# Patient Record
Sex: Female | Born: 1965 | Race: Black or African American | Hispanic: No | Marital: Married | Smoking: Never smoker
Health system: Southern US, Community
[De-identification: ages and names within clinical notes are randomized; demographics above are authoritative.]

## PROBLEM LIST (undated history)

## (undated) DIAGNOSIS — D649 Anemia, unspecified: Secondary | ICD-10-CM

## (undated) DIAGNOSIS — O169 Unspecified maternal hypertension, unspecified trimester: Secondary | ICD-10-CM

## (undated) DIAGNOSIS — Z9289 Personal history of other medical treatment: Secondary | ICD-10-CM

## (undated) DIAGNOSIS — I10 Essential (primary) hypertension: Secondary | ICD-10-CM

## (undated) DIAGNOSIS — D259 Leiomyoma of uterus, unspecified: Secondary | ICD-10-CM

## (undated) DIAGNOSIS — H5210 Myopia, unspecified eye: Secondary | ICD-10-CM

## (undated) HISTORY — DX: Personal history of other medical treatment: Z92.89

## (undated) HISTORY — DX: Unspecified maternal hypertension, unspecified trimester: O16.9

## (undated) HISTORY — PX: MYOMECTOMY: SHX85

## (undated) HISTORY — DX: Myopia, unspecified eye: H52.10

## (undated) HISTORY — PX: TONSILLECTOMY: SUR1361

## (undated) HISTORY — DX: Anemia, unspecified: D64.9

## (undated) HISTORY — DX: Essential (primary) hypertension: I10

## (undated) HISTORY — DX: Leiomyoma of uterus, unspecified: D25.9

---

## 2011-04-01 ENCOUNTER — Encounter: Payer: Self-pay | Admitting: Family Medicine

## 2011-05-10 ENCOUNTER — Encounter: Payer: Self-pay | Admitting: Family Medicine

## 2011-06-21 ENCOUNTER — Encounter: Payer: Self-pay | Admitting: Medical

## 2011-06-21 ENCOUNTER — Ambulatory Visit (INDEPENDENT_AMBULATORY_CARE_PROVIDER_SITE_OTHER): Payer: Managed Care, Other (non HMO) | Admitting: Medical

## 2011-06-21 VITALS — BP 142/80 | HR 92 | Temp 98.5°F | Resp 16 | Ht 67.0 in | Wt 199.0 lb

## 2011-06-21 DIAGNOSIS — IMO0001 Reserved for inherently not codable concepts without codable children: Secondary | ICD-10-CM

## 2011-06-21 DIAGNOSIS — Z862 Personal history of diseases of the blood and blood-forming organs and certain disorders involving the immune mechanism: Secondary | ICD-10-CM

## 2011-06-21 DIAGNOSIS — R030 Elevated blood-pressure reading, without diagnosis of hypertension: Secondary | ICD-10-CM

## 2011-06-21 DIAGNOSIS — M79609 Pain in unspecified limb: Secondary | ICD-10-CM

## 2011-06-21 DIAGNOSIS — M25569 Pain in unspecified knee: Secondary | ICD-10-CM

## 2011-06-21 LAB — CBC WITH DIFFERENTIAL/PLATELET
Basophils Absolute: 0 10*3/uL (ref 0.0–0.1)
Basophils Relative: 0 % (ref 0–1)
Eosinophils Absolute: 0.1 10*3/uL (ref 0.0–0.7)
MCH: 21.2 pg — ABNORMAL LOW (ref 26.0–34.0)
MCHC: 30.9 g/dL (ref 30.0–36.0)
Monocytes Relative: 6 % (ref 3–12)
Neutrophils Relative %: 69 % (ref 43–77)
Platelets: 298 10*3/uL (ref 150–400)
RDW: 17.3 % — ABNORMAL HIGH (ref 11.5–15.5)

## 2011-06-21 LAB — BASIC METABOLIC PANEL
Calcium: 9.2 mg/dL (ref 8.4–10.5)
Glucose, Bld: 95 mg/dL (ref 70–99)
Sodium: 138 mEq/L (ref 135–145)

## 2011-06-21 NOTE — Progress Notes (Signed)
Subjective:   HPI  Taylor Robbins is a 46 y.o. female who presents as a new patient today.   She notes 2-3 months ago was in [employer] and all the sudden had a charlie horse feeling in left leg.  The pain went away at least 30 minutes later.  Few days later felt this again, and felt a lump in her leg.  The lump comes and goes.  She notes that the pain has been slowly gotten worse and aggravating.  Feels the pain behind knee and along both sides of posterior knee.  Last week was on vacation and did a lot of walking, not sure if this aggravated things.   Denies leg swelling, no bruising, but husband thought the left leg behind the knee was more swollen compared to right leg.  The symptoms are intermittent.  No other aggravating or relieving factors.  Denies personal hx/o high cholesterol, DVT, or PE, denies injury or trauma to the leg.  No recent trauma, surgery, injury, or long travel.  No other c/o.  The following portions of the patient's history were reviewed and updated as appropriate: allergies, current medications, past family history, past medical history, past social history, past surgical history and problem list.  Past Medical History  Diagnosis Date  . Hypertension in pregnancy   . Anemia   . History of blood transfusion     remote hx/o  . Uterine fibroid   . Nearsightedness     wears glasses  . History of tonsillectomy     No Known Allergies   Review of Systems Gen: no fever, chills, weight changes Skin: no changes Lung: no sob, wheezing Heart: no CP, palpations, edema GI: negative GU: negative Neuro: +some tingling in left foot, but no numbness or weakness ROS reviewed and was negative other than noted in HPI or above.    Objective:   Physical Exam  General appearance: alert, no distress, WD/WN, pleasant black female Skin: no leg ecchymosis, erythema Neck: supple, no lymphadenopathy, no thyromegaly, no masses Heart: RRR, normal S1, S2, no murmurs Lungs: CTA  bilaterally, no wheezes, rhonchi, or rales MSK: mild tenderness of left medial lower thigh, mild tenderness popliteal area, but no obvious mass or palpable cord, no swelling, no asymmetry, otherwise LE nontender and normal ROM Ext: no edema Pulses:  Pedal pulses 1+ but symmetric  Assessment and Plan :     Encounter Diagnoses  Name Primary?  . Pain in joint, lower leg Yes  . Pain in limb   . Elevated blood pressure   . History of anemia    We will get baseline labs and set her up for venous duplex ultrasound of left leg and ABIs.  For now can use warm compresses, baby aspirin daily.

## 2011-06-22 ENCOUNTER — Other Ambulatory Visit: Payer: Self-pay | Admitting: Medical

## 2011-06-22 MED ORDER — FERROUS GLUCONATE 325 MG PO TABS: ORAL_TABLET | ORAL | Status: DC

## 2011-06-22 NOTE — Progress Notes (Signed)
Addended by: Leretha Dykes L on: 06/22/2011 02:50 PM   Modules accepted: Orders

## 2011-06-25 NOTE — Progress Notes (Signed)
Addended by: Leretha Dykes L on: 06/25/2011 11:17 AM   Modules accepted: Orders

## 2011-06-28 ENCOUNTER — Other Ambulatory Visit: Payer: Self-pay | Admitting: Medical

## 2011-06-28 ENCOUNTER — Ambulatory Visit
Admission: RE | Admit: 2011-06-28 | Discharge: 2011-06-28 | Disposition: A | Discharge status: Home / Self Care | Payer: Managed Care, Other (non HMO) | Source: Ambulatory Visit | Attending: Medical | Admitting: Medical

## 2011-06-28 DIAGNOSIS — M25569 Pain in unspecified knee: Secondary | ICD-10-CM

## 2011-06-28 DIAGNOSIS — M79609 Pain in unspecified limb: Secondary | ICD-10-CM

## 2011-07-05 ENCOUNTER — Inpatient Hospital Stay
Admission: RE | Admit: 2011-07-05 | Discharge: 2011-07-05 | Payer: Managed Care, Other (non HMO) | Source: Ambulatory Visit | Attending: Medical | Admitting: Medical

## 2011-07-05 ENCOUNTER — Other Ambulatory Visit: Payer: Managed Care, Other (non HMO)

## 2011-07-19 ENCOUNTER — Other Ambulatory Visit: Payer: Managed Care, Other (non HMO)

## 2011-07-22 ENCOUNTER — Encounter: Payer: Self-pay | Admitting: Family Medicine

## 2011-07-22 ENCOUNTER — Ambulatory Visit (INDEPENDENT_AMBULATORY_CARE_PROVIDER_SITE_OTHER): Payer: Managed Care, Other (non HMO) | Admitting: Family Medicine

## 2011-07-22 VITALS — BP 144/100 | HR 75 | Wt 197.0 lb

## 2011-07-22 DIAGNOSIS — IMO0001 Reserved for inherently not codable concepts without codable children: Secondary | ICD-10-CM

## 2011-07-22 DIAGNOSIS — R030 Elevated blood-pressure reading, without diagnosis of hypertension: Secondary | ICD-10-CM

## 2011-07-22 NOTE — Progress Notes (Signed)
  Subjective:    Patient ID: Taylor Robbins, female    DOB: [birth year], 46 y.o.   MRN: [id number]  HPI She is here for a blood pressure check. Apparently recently at her dentist office she did have an elevated pressure. Review of record indicates she also had slightly elevated pressure with her last visit. She has a previous history of pregnancy related hypertension.   Review of Systems     Objective:   Physical Exam Alert and in no distress. Blood pressure is recorded.       Assessment & Plan:   1. Elevated blood pressure    approximately 15 minutes spent discussing elevated blood pressure in regard to diet, exercise, footcare, medications, caffeine and alcohol. She will start a diet and exercise program and return here in one month for a recheck.

## 2011-07-22 NOTE — Patient Instructions (Signed)
Work on diet, exercise, weight reduction, cutting back on salt. Cut back on "white food"

## 2011-08-23 ENCOUNTER — Ambulatory Visit (INDEPENDENT_AMBULATORY_CARE_PROVIDER_SITE_OTHER): Payer: Managed Care, Other (non HMO) | Admitting: Family Medicine

## 2011-08-23 ENCOUNTER — Encounter: Payer: Self-pay | Admitting: Family Medicine

## 2011-08-23 VITALS — BP 144/90 | HR 78 | Wt 198.0 lb

## 2011-08-23 DIAGNOSIS — I10 Essential (primary) hypertension: Secondary | ICD-10-CM

## 2011-08-23 MED ORDER — HYDROCHLOROTHIAZIDE 12.5 MG PO CAPS: 12.5000 mg | ORAL_CAPSULE | Freq: Every day | ORAL | Status: DC

## 2011-08-23 NOTE — Progress Notes (Signed)
  Subjective:    Patient ID: Taylor Robbins, female    DOB: 11-Nov-1965, 46 y.o.   MRN: [id number]  HPI Is here for recheck. She states that she has made diet and exercise changes.   Review of Systems     Objective:   Physical Exam Hurt and in no distress. Blood pressure and weight were reviewed.       Assessment & Plan:   1. Hypertension  hydrochlorothiazide (MICROZIDE) 12.5 MG capsule   since her blood pressures not really changed, will place her on HCTZ. I discussed the fact she is to continue diet and exercise. Recheck here in one month

## 2011-09-21 ENCOUNTER — Encounter: Payer: Self-pay | Admitting: Family Medicine

## 2011-09-21 ENCOUNTER — Ambulatory Visit (INDEPENDENT_AMBULATORY_CARE_PROVIDER_SITE_OTHER): Payer: Managed Care, Other (non HMO) | Admitting: Family Medicine

## 2011-09-21 VITALS — BP 132/88 | HR 86 | Wt 199.0 lb

## 2011-09-21 DIAGNOSIS — I10 Essential (primary) hypertension: Secondary | ICD-10-CM

## 2011-09-21 DIAGNOSIS — E669 Obesity, unspecified: Secondary | ICD-10-CM

## 2011-09-21 NOTE — Progress Notes (Signed)
  Subjective:    Patient ID: Taylor Robbins, female    DOB: 1966-02-13, 46 y.o.   MRN: [id number]  HPI She is here for recheck. She is taking her fluid pill without difficulty. She has not been able to exercise like she likes.   Review of Systems     Objective:   Physical Exam  Alert and in no distress otherwise not examined blood pressure is recorded      Assessment & Plan:  Hypertension now under good control. Continue present medication regimen. I encouraged her to become more physically active.

## 2011-11-04 ENCOUNTER — Ambulatory Visit (INDEPENDENT_AMBULATORY_CARE_PROVIDER_SITE_OTHER): Payer: Managed Care, Other (non HMO) | Admitting: Family Medicine

## 2011-11-04 ENCOUNTER — Encounter: Payer: Self-pay | Admitting: Family Medicine

## 2011-11-04 VITALS — BP 126/70 | HR 80 | Wt 196.0 lb

## 2011-11-04 DIAGNOSIS — I10 Essential (primary) hypertension: Secondary | ICD-10-CM

## 2011-11-04 NOTE — Progress Notes (Signed)
  Subjective:    Patient ID: Taylor Robbins, female    DOB: 08-01-1965, 46 y.o.   MRN: [id number]  HPI She has a three-week history of a popping sensation in her ears and slight dizziness . She is concerned that this could be related to her blood pressure medication. She has no underlying history of allergies and has had no difficulty with sneezing, itchy watery eyes, rhinorrhea.   Review of Systems     Objective:   Physical Exam Alert and in no distress. TMs clear. Throat is clear. Neck is supple without adenopathy. Blood pressure is recorded      Assessment & Plan:  Hypertension. Continue on present medication. If she has further difficulty with popping in the dizziness, suggested judicious use of a decongestant for short period of time.

## 2011-11-10 ENCOUNTER — Telehealth: Payer: Self-pay | Admitting: Family Medicine

## 2011-11-10 NOTE — Telephone Encounter (Signed)
LM

## 2011-11-12 ENCOUNTER — Encounter: Payer: Self-pay | Admitting: Medical

## 2011-11-12 ENCOUNTER — Ambulatory Visit (INDEPENDENT_AMBULATORY_CARE_PROVIDER_SITE_OTHER): Payer: Managed Care, Other (non HMO) | Admitting: Medical

## 2011-11-12 VITALS — BP 128/90 | HR 80 | Temp 98.3°F | Resp 16 | Wt 201.0 lb

## 2011-11-12 DIAGNOSIS — I10 Essential (primary) hypertension: Secondary | ICD-10-CM

## 2011-11-12 DIAGNOSIS — D649 Anemia, unspecified: Secondary | ICD-10-CM

## 2011-11-12 DIAGNOSIS — R42 Dizziness and giddiness: Secondary | ICD-10-CM

## 2011-11-12 LAB — T4, FREE: Free T4: 1.25 ng/dL (ref 0.80–1.80)

## 2011-11-12 LAB — RETICULOCYTES
ABS Retic: 80.3 10*3/uL (ref 19.0–186.0)
RBC.: 4.46 MIL/uL (ref 3.87–5.11)
Retic Ct Pct: 1.8 % (ref 0.4–2.3)

## 2011-11-12 LAB — IRON AND TIBC
%SAT: 15 % — ABNORMAL LOW (ref 20–55)
Iron: 61 ug/dL (ref 42–145)
TIBC: 399 ug/dL (ref 250–470)
UIBC: 338 ug/dL (ref 125–400)

## 2011-11-12 LAB — CBC WITH DIFFERENTIAL/PLATELET
HCT: 33.2 % — ABNORMAL LOW (ref 36.0–46.0)
Hemoglobin: 11.4 g/dL — ABNORMAL LOW (ref 12.0–15.0)
Lymphocytes Relative: 22 % (ref 12–46)
Lymphs Abs: 1.2 10*3/uL (ref 0.7–4.0)
MCHC: 34.3 g/dL (ref 30.0–36.0)
Monocytes Absolute: 0.4 10*3/uL (ref 0.1–1.0)
Monocytes Relative: 7 % (ref 3–12)
Neutro Abs: 3.7 10*3/uL (ref 1.7–7.7)
Neutrophils Relative %: 68 % (ref 43–77)
RBC: 4.46 MIL/uL (ref 3.87–5.11)

## 2011-11-12 LAB — FERRITIN: Ferritin: 12 ng/mL (ref 10–291)

## 2011-11-12 LAB — HIGH SENSITIVITY CRP: CRP, High Sensitivity: 2.3 mg/L

## 2011-11-12 LAB — TSH: TSH: 2.424 u[IU]/mL (ref 0.350–4.500)

## 2011-11-12 MED ORDER — MECLIZINE HCL 25 MG PO TABS: 25.0000 mg | ORAL_TABLET | Freq: Three times a day (TID) | ORAL | Status: AC | PRN

## 2011-11-12 NOTE — Progress Notes (Addendum)
Subjective: Here for concerns about EKG.  Last week had a physical for life insurance application.  They ended up calling her back and telling her she had EKG that was abnormal and to f/u with primary care.  In general other than some disequilibrium and dizziness, she denies other health concerns.  No chest pain, SOB, DOE, palpitations, syncope, no nausea, sweats, numbness.  She typically exercises with spin class or walking a few days per week.  She is a nonsmoker.    Of note, I last saw her back in April for anemia.   She was started on iron.  She denies any bleeding.  She does have hx/o fibroids but they were surgically removed. No prior colonoscopy.  She reports intermittent dizziness that last for seconds, like room spinning.   Saw Dr. Susann Givens for this recently, but the symptoms persist.  Denies cold symptoms fever, syncope or vision changes.  No weakness, numbness, tingling.   Past Medical History  Diagnosis Date  . Hypertension in pregnancy   . Anemia   . History of blood transfusion     remote hx/o  . Uterine fibroid   . Nearsightedness     wears glasses  . History of tonsillectomy    No Known Allergies  Current Outpatient Prescriptions on File Prior to Visit  Medication Sig Dispense Refill  . ferrous gluconate (FERGON) 325 MG tablet 1 tablet po BID with orange juice or meals  60 tablet  3  . hydrochlorothiazide (MICROZIDE) 12.5 MG capsule Take 1 capsule (12.5 mg total) by mouth daily.  30 capsule  11    Past Medical History  Diagnosis Date  . Hypertension in pregnancy   . Anemia   . History of blood transfusion     remote hx/o  . Uterine fibroid   . Nearsightedness     wears glasses  . History of tonsillectomy     Past Surgical History  Procedure Date  . Myomectomy   . Cesarean section     Family History  Problem Relation Age of Onset  . Hypertension Father   . Stroke Father     died of CVA  . Heart disease Father     CHF  . Hypertension Mother   . Heart  disease Sister 30    valvuloplasty    History   Social History  . Marital Status: Married    Spouse Name: N/A    Number of Children: N/A  . Years of Education: N/A   Occupational History  . Not on file.   Social History Main Topics  . Smoking status: Never Smoker   . Smokeless tobacco: Not on file  . Alcohol Use: No  . Drug Use: No  . Sexually Active: Not on file   Other Topics Concern  . Not on file   Social History Narrative  . No narrative on file    Reviewed their medical, surgical, family, social, medication, and allergy history and updated chart as appropriate.  ROS as noted in HPI   Objective:   Physical Exam  Filed Vitals:   11/12/11 1006  BP: 128/90  Pulse: 80  Temp: 98.3 F (36.8 C)  Resp: 16    General appearance: alert, no distress, WD/WN Eyes with mild exophthalmos Neck: supple, no lymphadenopathy, no thyromegaly, no masses, no bruits Heart: RRR, normal S1, S2, no murmurs Lungs: CTA bilaterally, no wheezes, rhonchi, or rales Pulses: 2+ symmetric, upper and lower extremities, normal cap refill   Adult ECG Report  Indication: report of insurance physical abnormal EKG, HTN  Rate: 77bpm  Rhythm: normal sinus rhythm  QRS Axis: 18 degrees  PR Interval:  QRS Duration: 98ms  QTc:  Conduction Disturbances: none  Other Abnormalities: nonspecific T wave flattening V3-6,   Patient's cardiac risk factors are: family history of premature cardiovascular disease and hypertension.  EKG comparison: insurance physical EKG 11/08/11   Narrative Interpretation: no acute change or change from 11/08/11, nonspecific T wave abnormality in III and V3-6, no acute changes    Assessment and Plan :    Encounter Diagnoses  Name Primary?  . Essential hypertension, benign Yes  . Anemia   . Dizziness     HTN - c/t same medication, HCTZ 12.5mg  daily.   Reviewed EKG today.  No major worrisome findings.   We discussed her risk factors for heart disease  (premature family hx/o, HTN).  Advised she increase exercise, eat healthy low fat diet.  I reviewed her recent labs including cholesterol.  We discussed possible statin, but will try 66mo of lifestyle interventions first.  Additional labs today for cardiac eval (CRP, homocysteine, Vit D).  Discussed additional cardiac eval such as stress test if she desires, but no urgent referral needed for evaluation.  Anemia - additional labs today.   May need GI referral.  Dizziness - script trial for meclizine for vertigo.

## 2011-11-13 LAB — HOMOCYSTEINE: Homocysteine: 13.1 umol/L (ref 4.0–15.4)

## 2011-11-16 ENCOUNTER — Other Ambulatory Visit: Payer: Self-pay | Admitting: Medical

## 2011-11-16 MED ORDER — FERROUS GLUCONATE 324 (38 FE) MG PO TABS: 324.0000 mg | ORAL_TABLET | Freq: Two times a day (BID) | ORAL | Status: DC

## 2011-11-17 ENCOUNTER — Encounter: Payer: Self-pay | Admitting: Medical

## 2011-11-17 ENCOUNTER — Telehealth: Payer: Self-pay | Admitting: Family Medicine

## 2011-11-17 NOTE — Telephone Encounter (Signed)
Patient is aware of her appointment to see the cardiologists on 12/08/11 @ [facility]. CLS

## 2011-11-17 NOTE — Telephone Encounter (Signed)
Patient states that she went by her pharmacy and her RX for Vit. D and it was not there so can you send the RX in to the pharmacy for her to pick up. Thanks  CLS

## 2011-11-17 NOTE — Telephone Encounter (Signed)
Patient is aware of her appointment with cardiologist. [facility] 12/08/11 @ 1030 am With Dr. Shirlee Latch [phone]  They are on EPIC

## 2011-11-17 NOTE — Telephone Encounter (Signed)
Message copied by Janeice Robinson on Wed Nov 17, 2011  3:28 PM ------      Message from: Jac Canavan      Created: Tue Nov 16, 2011  8:16 AM       See other msg.   After reviewing her EKG with Dr. Susann Givens, there are some subtle changes in 2 leads which may or may not represent ischemia which means a change in blood flow to parts of the heart.  Thus, lets go ahead with cardiology referral for evaluation just to make sure nothing is at risk of blockage.

## 2011-11-18 ENCOUNTER — Encounter: Payer: Self-pay | Admitting: Family Medicine

## 2011-11-18 ENCOUNTER — Telehealth: Payer: Self-pay | Admitting: Family Medicine

## 2011-11-18 ENCOUNTER — Telehealth: Payer: Self-pay | Admitting: Medical

## 2011-11-18 MED ORDER — ERGOCALCIFEROL 1.25 MG (50000 UT) PO CAPS: 50000.0000 [IU] | ORAL_CAPSULE | ORAL | Status: DC

## 2011-11-18 NOTE — Telephone Encounter (Signed)
Let her know that a multivitamin with B12 is certainly appropriate. If she wants to discuss possible injections have her make an appointment so we can discuss this in more detail

## 2011-11-18 NOTE — Telephone Encounter (Signed)
Message copied by Janeice Robinson on Thu Nov 18, 2011  8:41 AM ------      Message from: Aleen Campi, DAVID S      Created: Wed Nov 17, 2011  6:01 PM       pls send letter to insurance as requested.  The copy is in nurse station printer

## 2011-11-18 NOTE — Telephone Encounter (Signed)
I fax over the letter to Antonietta Jewel @ [phone] per the patients request. CLS

## 2011-11-18 NOTE — Telephone Encounter (Signed)
I sent the RX for the Vitamin D to her pharmacy and I lmom letting her know it was sent to her pharmacy. CLS

## 2011-11-18 NOTE — Telephone Encounter (Signed)
Pt aware and verbalized understanding.  

## 2011-11-18 NOTE — Telephone Encounter (Signed)
Pt called and stated that she is interested in taking vit b 12. She would like to know if that is ok. Please call pt.

## 2011-12-06 ENCOUNTER — Other Ambulatory Visit (INDEPENDENT_AMBULATORY_CARE_PROVIDER_SITE_OTHER): Payer: Managed Care, Other (non HMO)

## 2011-12-06 DIAGNOSIS — Z139 Encounter for screening, unspecified: Secondary | ICD-10-CM

## 2011-12-06 LAB — HEMOCCULT GUIAC POC 1CARD (OFFICE): Card #3 Fecal Occult Blood, POC: POSITIVE

## 2011-12-07 ENCOUNTER — Encounter: Payer: Self-pay | Admitting: Internal Medicine

## 2011-12-08 ENCOUNTER — Encounter: Payer: Self-pay | Admitting: Cardiology

## 2011-12-08 ENCOUNTER — Ambulatory Visit (INDEPENDENT_AMBULATORY_CARE_PROVIDER_SITE_OTHER): Payer: Managed Care, Other (non HMO) | Admitting: Cardiology

## 2011-12-08 VITALS — BP 121/100 | HR 82 | Resp 18 | Ht 66.0 in | Wt 200.4 lb

## 2011-12-08 DIAGNOSIS — I10 Essential (primary) hypertension: Secondary | ICD-10-CM

## 2011-12-08 DIAGNOSIS — R9431 Abnormal electrocardiogram [ECG] [EKG]: Secondary | ICD-10-CM

## 2011-12-08 NOTE — Progress Notes (Signed)
Patient ID: Taylor Robbins, female   DOB: [birth year], 46 y.o.   MRN: [id number] PCP: Dr. Susann Givens  46 yo with history of HTN presents for evaluation of abnormal ECG.  Patient did a life insurance physical and was told that her ECG was abnormal and she needed further evaluation.  Her PCP referred her to this office.  She was recently diagnosed with HTN (8/13) and started on HCTZ.  BP is high today but she did not take her HCTZ.  She does well in general.  She has intermittent positional vertigo.  She exercises regularly, including a spin class 2-3 x /wk.  No exertional dyspnea, chest pain, syncope, or lightheadedness.  Her ECG showed NSR with nonspecific anterior and lateral T wave flattening.   Labs (8/13): TSH normal  PMH: 1. BPPV 2. HTN 3. Anemia  SH: Nonsmoker.  Married, works at [employer].    FH: Father with CHF and CVA at 65.  Sister with heart valve replacement in her 7s.  HTN.  ROS: All systems reviewed and negative except as per HPI.   Current Outpatient Prescriptions  Medication Sig Dispense Refill  . aspirin 81 MG tablet Take 81 mg by mouth daily.      . ergocalciferol (VITAMIN D2) 50000 UNITS capsule Take 1 capsule (50,000 Units total) by mouth once a week.  4 capsule  1  . ferrous gluconate (FERGON) 324 MG tablet Take 1 tablet (324 mg total) by mouth 2 (two) times daily.  60 tablet  2  . hydrochlorothiazide (MICROZIDE) 12.5 MG capsule Take 1 capsule (12.5 mg total) by mouth daily.  30 capsule  11  . meclizine (ANTIVERT) 25 MG tablet Take 25 mg by mouth 3 (three) times daily as needed.      . Multiple Vitamins-Calcium (ONE-A-DAY WITHIN PO) Take 1 tablet by mouth daily.      Marland Kitchen DISCONTD: ferrous gluconate (FERGON) 325 MG tablet 1 tablet po BID with orange juice or meals  60 tablet  3    BP 121/100  Pulse 82  Resp 18  Ht 5\' 6"  (1.676 m)  Wt 200 lb 6.4 oz (90.901 kg)  BMI 32.35 kg/m2  SpO2 93% General: NAD Neck: No JVD, no thyromegaly or thyroid nodule.  Lungs: Clear to  auscultation bilaterally with normal respiratory effort. CV: Nondisplaced PMI.  Heart regular S1/S2, no S3/S4, no murmur.  No peripheral edema.  No carotid bruit.  Normal pedal pulses.  Abdomen: Soft, nontender, no hepatosplenomegaly, no distention.  Skin: Intact without lesions or rashes.  Neurologic: Alert and oriented x 3.  Psych: Normal affect. Extremities: No clubbing or cyanosis.  HEENT: Normal.   Assessment/Plan:  1. Abnormal ECG: ECG is abnormal, but this is nonspecific.  It changes could be related to HTN.  I will have her get an echocardiogram to make sure her heart is structurally normal.  If echo is normal, she does not need further diagnostic evaluation.  She has excellent exercise tolerance with no limitations. 2. HTN: BP high today but she did not take her HCTZ.  She will get a BP cuff and we will call her in 2 wks to see what her numbers are running.   Marca Ancona 12/08/2011

## 2011-12-08 NOTE — Patient Instructions (Addendum)
Your physician has requested that you have an echocardiogram. Echocardiography is a painless test that uses sound waves to create images of your heart. It provides your doctor with information about the size and shape of your heart and how well your heart's chambers and valves are working. This procedure takes approximately one hour. There are no restrictions for this procedure.  Get a blood pressure cuff. Take and record your blood pressure daily. I will call you in 2 weeks to get the readings. Taylor Robbins [phone]  Your physician recommends that you schedule a follow-up appointment in: as needed with Dr Shirlee Latch.

## 2011-12-10 ENCOUNTER — Other Ambulatory Visit (HOSPITAL_COMMUNITY): Payer: Managed Care, Other (non HMO)

## 2011-12-14 ENCOUNTER — Ambulatory Visit (HOSPITAL_COMMUNITY): Payer: BC Managed Care – PPO | Attending: Cardiology | Admitting: Radiology

## 2011-12-14 DIAGNOSIS — R9431 Abnormal electrocardiogram [ECG] [EKG]: Secondary | ICD-10-CM

## 2011-12-14 DIAGNOSIS — I369 Nonrheumatic tricuspid valve disorder, unspecified: Secondary | ICD-10-CM | POA: Insufficient documentation

## 2011-12-14 DIAGNOSIS — I10 Essential (primary) hypertension: Secondary | ICD-10-CM | POA: Insufficient documentation

## 2011-12-14 DIAGNOSIS — I379 Nonrheumatic pulmonary valve disorder, unspecified: Secondary | ICD-10-CM | POA: Insufficient documentation

## 2011-12-14 NOTE — Progress Notes (Signed)
Echocardiogram performed.  

## 2011-12-22 ENCOUNTER — Telehealth: Payer: Self-pay | Admitting: *Deleted

## 2011-12-22 NOTE — Telephone Encounter (Signed)
HTN: BP high today but she did not take her HCTZ. She will get a BP cuff and we will call her in 2 wks to see what her numbers are running. 12/08/11      12/22/11 LMTCB for pt.

## 2011-12-23 NOTE — Telephone Encounter (Signed)
Pt.notified

## 2011-12-23 NOTE — Telephone Encounter (Signed)
Spoke with pt. Pt did not have individual readings but said the times she has checked her BP it has been in the 118/80 range. I will forward to Dr Shirlee Latch for review.

## 2011-12-23 NOTE — Telephone Encounter (Signed)
Readings ok 

## 2011-12-31 ENCOUNTER — Ambulatory Visit: Payer: Managed Care, Other (non HMO) | Admitting: Internal Medicine

## 2012-02-07 ENCOUNTER — Telehealth: Payer: Self-pay | Admitting: Internal Medicine

## 2012-02-07 MED ORDER — MECLIZINE HCL 25 MG PO TABS: 25.0000 mg | ORAL_TABLET | Freq: Three times a day (TID) | ORAL | Status: DC | PRN

## 2012-02-07 NOTE — Telephone Encounter (Signed)
Renew this but check with the pharmacy on when she filled this last

## 2012-02-07 NOTE — Telephone Encounter (Signed)
Sent in med the last refill she got as of Aug 30

## 2012-03-20 ENCOUNTER — Ambulatory Visit: Payer: Self-pay | Admitting: Internal Medicine

## 2012-03-20 ENCOUNTER — Telehealth: Payer: Self-pay | Admitting: Internal Medicine

## 2012-04-04 ENCOUNTER — Encounter: Payer: Self-pay | Admitting: Medical

## 2012-04-04 ENCOUNTER — Ambulatory Visit (INDEPENDENT_AMBULATORY_CARE_PROVIDER_SITE_OTHER): Payer: BC Managed Care – PPO | Admitting: Medical

## 2012-04-04 VITALS — BP 112/80 | HR 82 | Temp 98.4°F | Resp 16 | Wt 205.0 lb

## 2012-04-04 DIAGNOSIS — R059 Cough, unspecified: Secondary | ICD-10-CM

## 2012-04-04 DIAGNOSIS — R05 Cough: Secondary | ICD-10-CM

## 2012-04-04 DIAGNOSIS — R053 Chronic cough: Secondary | ICD-10-CM

## 2012-04-04 DIAGNOSIS — R195 Other fecal abnormalities: Secondary | ICD-10-CM

## 2012-04-04 DIAGNOSIS — I10 Essential (primary) hypertension: Secondary | ICD-10-CM

## 2012-04-04 DIAGNOSIS — D649 Anemia, unspecified: Secondary | ICD-10-CM

## 2012-04-04 LAB — CBC WITH DIFFERENTIAL/PLATELET
Basophils Absolute: 0 10*3/uL (ref 0.0–0.1)
Basophils Relative: 1 % (ref 0–1)
Eosinophils Absolute: 0.2 10*3/uL (ref 0.0–0.7)
HCT: 34.8 % — ABNORMAL LOW (ref 36.0–46.0)
Hemoglobin: 11.7 g/dL — ABNORMAL LOW (ref 12.0–15.0)
MCH: 26.2 pg (ref 26.0–34.0)
MCHC: 33.6 g/dL (ref 30.0–36.0)
Monocytes Absolute: 0.3 10*3/uL (ref 0.1–1.0)
Monocytes Relative: 6 % (ref 3–12)
Neutro Abs: 3.1 10*3/uL (ref 1.7–7.7)
Neutrophils Relative %: 59 % (ref 43–77)
RDW: 15.2 % (ref 11.5–15.5)

## 2012-04-04 MED ORDER — BENZONATATE 100 MG PO CAPS: 100.0000 mg | ORAL_CAPSULE | Freq: Three times a day (TID) | ORAL | Status: DC | PRN

## 2012-04-04 MED ORDER — METHYLPREDNISOLONE 4 MG PO KIT: PACK | ORAL | Status: DC

## 2012-04-04 NOTE — Patient Instructions (Signed)

## 2012-04-04 NOTE — Progress Notes (Signed)
Subjective: Here for cough. Had flu like illness on new years, this resolved, but cough hasn't resolved.  She doesn't feel particularly sick, but she notes has heavy breathing and cough spells at times throughout the day.  No productive cough, no mucous, no runny nose, sneezing, allergy or GERD symptoms, no chest pain, lower extremity edema.  No hx/o asthma. Nonsmoker.    compliant with BP medication, HCTZ.     She hasn't seen GI yet, had to reschedule to next month.   She is taking her iron, but only once daily.    Taking Vit D OTC now.  Past Medical History  Diagnosis Date  . Hypertension in pregnancy   . Anemia   . History of blood transfusion     remote hx/o  . Uterine fibroid   . Nearsightedness     wears glasses  . Hypertension     Review of Systems Constitutional: -fever, -chills, -sweats, -unexpected weight change, -fatigue ENT: -runny nose, -ear pain, -sore throat Cardiology:  -chest pain, -palpitations, -edema Respiratory: +cough, +shortness of breath during cough spells, -wheezing Gastroenterology: -abdominal pain, -nausea, -vomiting, -diarrhea, -constipation  Musculoskeletal: -arthralgias, -myalgias, -joint swelling, -back pain Urology: -dysuria, -difficulty urinating, -hematuria, -urinary frequency, -urgency Neurology: -headache, -weakness, -tingling, -numbness   Objective: Filed Vitals:   04/04/12 0921  BP: 112/80  Pulse: 82  Temp: 98.4 F (36.9 C)  Resp: 16    General appearance: alert, no distress, WD/WN HEENT: normocephalic, sclerae anicteric, TMs pearly, nares patent, no discharge or erythema, pharynx normal Oral cavity: MMM, no lesions Neck: supple, no lymphadenopathy, no thyromegaly, no masses Heart: RRR, normal S1, S2, no murmurs Lungs: CTA bilaterally, no wheezes, rhonchi, or rales Abdomen: +bs, soft, non tender, non distended, no masses, no hepatomegaly, no splenomegaly Pulses: 2+ symmetric, upper and lower extremities, normal cap refill Ext:  no edema  Assessment: Encounter Diagnoses  Name Primary?  . Chronic cough Yes  . Anemia   . Essential hypertension, benign   . Occult blood in stools    Plan: Chronic cough - discussed possible causes.  Offered chest xray to rule out potential causes but she declines.  At this point, possible post infectious cough given the flu like illness in early January.  Begin medrol dose pack, tessalon Perles.  If not improving in 2 wk, recheck.  Anemia - labs today, c/t iron.  HTN - controled, routine CMET lab today  Occult blood in stools - f/u as planned next month with gastroenterology

## 2012-04-05 LAB — COMPREHENSIVE METABOLIC PANEL
ALT: 15 U/L (ref 0–35)
AST: 20 U/L (ref 0–37)
Albumin: 4 g/dL (ref 3.5–5.2)
Alkaline Phosphatase: 63 U/L (ref 39–117)
BUN: 5 mg/dL — ABNORMAL LOW (ref 6–23)
Calcium: 8.7 mg/dL (ref 8.4–10.5)
Chloride: 107 mEq/L (ref 96–112)
Potassium: 4.7 mEq/L (ref 3.5–5.3)

## 2012-04-25 NOTE — Telephone Encounter (Signed)
Due to time frame of appt.  Will close encounter

## 2012-08-15 ENCOUNTER — Other Ambulatory Visit: Payer: Self-pay

## 2012-08-15 ENCOUNTER — Encounter (HOSPITAL_COMMUNITY): Payer: Self-pay

## 2012-08-15 ENCOUNTER — Encounter (HOSPITAL_COMMUNITY)
Admission: RE | Admit: 2012-08-15 | Discharge: 2012-08-15 | Disposition: A | Discharge status: Home / Self Care | Payer: BC Managed Care – PPO | Source: Ambulatory Visit | Attending: Obstetrics and Gynecology | Admitting: Obstetrics and Gynecology

## 2012-08-15 DIAGNOSIS — Z01812 Encounter for preprocedural laboratory examination: Secondary | ICD-10-CM | POA: Insufficient documentation

## 2012-08-15 DIAGNOSIS — Z01818 Encounter for other preprocedural examination: Secondary | ICD-10-CM | POA: Insufficient documentation

## 2012-08-15 LAB — CBC
HCT: 36 % (ref 36.0–46.0)
MCHC: 33.6 g/dL (ref 30.0–36.0)
Platelets: 246 10*3/uL (ref 150–400)
RDW: 14.1 % (ref 11.5–15.5)

## 2012-08-15 LAB — BASIC METABOLIC PANEL
BUN: 17 mg/dL (ref 6–23)
GFR calc Af Amer: 71 mL/min — ABNORMAL LOW (ref >90)
GFR calc non Af Amer: 62 mL/min — ABNORMAL LOW (ref >90)
Potassium: 4.1 mEq/L (ref 3.5–5.1)
Sodium: 138 mEq/L (ref 135–145)

## 2012-08-15 LAB — SURGICAL PCR SCREEN: MRSA, PCR: NEGATIVE

## 2012-08-15 NOTE — Patient Instructions (Addendum)
[name]  08/15/2012   Your procedure is scheduled on:  08/28/12  Enter through the Main Entrance of [facility] at 6 AM.  Pick up the phone at the desk and dial [phone].   Call this number if you have problems the morning of surgery: [phone]   Remember:   Do not eat food:After Midnight.  Do not drink clear liquids: After Midnight.  Take these medicines the morning of surgery with A SIP OF WATER: NA   Do not wear jewelry, make-up or nail polish.  Do not wear lotions, powders, or perfumes. You may wear deodorant.  Do not shave 48 hours prior to surgery.  Do not bring valuables to the hospital.  [facility] is not responsible                  for any belongings or valuables brought to the hospital.  Contacts, dentures or bridgework may not be worn into surgery.  Leave suitcase in the car. After surgery it may be brought to your room.  For patients admitted to the hospital, checkout time is 11:00 AM the day of                discharge.   Patients discharged the day of surgery will not be allowed to drive                   home.  Name and phone number of your driver: NA  Special Instructions: Shower using CHG 2 nights before surgery and the night before surgery.  If you shower the day of surgery use CHG.  Use special wash - you have one bottle of CHG for all showers.  You should use approximately 1/3 of the bottle for each shower.   Please read over the following fact sheets that you were given: MRSA Information

## 2012-08-15 NOTE — Pre-Procedure Instructions (Signed)
Today's EKG (08/15/12) and previous EKGs reviewed and accepted by Dr Rodman Pickle.

## 2012-08-23 ENCOUNTER — Encounter (HOSPITAL_COMMUNITY): Payer: Self-pay | Admitting: Pharmacist

## 2012-08-25 NOTE — Progress Notes (Signed)
Dr Henderson Cloud called to request orders for upcoming surgery, Requested I call Dr Rana Snare, left message on voicemail for Dr Rana Snare

## 2012-08-27 MED ORDER — DEXTROSE 5 % IV SOLN
2.0000 g | INTRAVENOUS | Status: AC
  Administered 2012-08-28: 2 g via INTRAVENOUS
  Filled 2012-08-27: qty 2

## 2012-08-27 NOTE — H&P (Addendum)
  Taylor Robbins presents today for preop evaluation for hysterectomy.  She has been diagnosed with a 12-week sized fibroid uterus.  She has had 2 previous myomectomies, 2 previous cesarean sections.  Having problems with increasing pressure, pain, menorrhagia to the point that it is excessively heavy and socially incapacitating.  She has had a tubal ligation.  Because of the ongoing symptoms, she desires definitive surgical intervention and presents today.  Because of previous myomectomies and C-sections, we will plan to do this through laparotomy.  She does desire preservation of the ovaries.  Past medical history is significant for hypertension and anemia due to the menorrhagia.  Past surgical history:  2 myomectomies and 2 cesarean sections with a tubal ligation.  Medications include iron for anemia, vitamin D and hydrochlorothiazide 12.5 mg a day.  She has no known drug allergies.   O: Blood pressure 124/82.  Hemoglobin 12.1.  Heart regular rate and rhythm.  Lungs are clear to auscultation bilaterally.  Abdomen is nondistended, nontender.  She has a well-healed vertical midline incision from previous myomectomy.  She has a Pfannenstiel skin incision which appears to be easily seen from previous cesarean sections and another myomectomy. The uterus is palpable 2 fingerbreadths over the pubic symphysis.  Pelvic exam:  Uterus is globular, 12 weeks' size, irregular.  No adnexal masses are palpable.  Nontender to deep palpation. A/P: 12-week sized fibroid uterus, previous myomectomies times 2,  Previous cesarean sections times 2 with the high likelihood of pelvic adhesions and a very narrow pelvis, AUB, Menorrhagia.   Patient desires definitive surgical intervention and requests hysterectomy.  Plan laparotomy with total abdominal hysterectomy and preservation of the ovaries.  Discussed the procedure at length, its risks, its benefits, which include but are not limited to risk of infection, bleeding, damage to the ureters,  ovaries, bowel or bladder; risks associated with blood transfusion.  Benefits would be no further fibroids or bleeding which should also help correct her anemia.  We discussed the procedure its length and its recovery at length.  All of her questions were answered.    08/28/12 0715 This patient has been seen and examined.   All of her questions were answered.  Labs and vital signs reviewed.  Informed consent has been obtained.  The History and Physical is current. DL

## 2012-08-28 ENCOUNTER — Encounter (HOSPITAL_COMMUNITY): Payer: Self-pay | Admitting: Anesthesiology

## 2012-08-28 ENCOUNTER — Encounter (HOSPITAL_COMMUNITY)
Admission: RE | Disposition: A | Discharge status: Home / Self Care | Payer: Self-pay | Source: Ambulatory Visit | Attending: Obstetrics and Gynecology

## 2012-08-28 ENCOUNTER — Encounter (HOSPITAL_COMMUNITY): Payer: Self-pay

## 2012-08-28 ENCOUNTER — Inpatient Hospital Stay (HOSPITAL_COMMUNITY): Payer: BC Managed Care – PPO | Admitting: Anesthesiology

## 2012-08-28 ENCOUNTER — Inpatient Hospital Stay (HOSPITAL_COMMUNITY)
Admission: RE | Admit: 2012-08-28 | Discharge: 2012-08-31 | DRG: 359 | Disposition: A | Discharge status: Home / Self Care | Payer: BC Managed Care – PPO | Source: Ambulatory Visit | Attending: Obstetrics and Gynecology | Admitting: Obstetrics and Gynecology

## 2012-08-28 DIAGNOSIS — N920 Excessive and frequent menstruation with regular cycle: Principal | ICD-10-CM | POA: Diagnosis present

## 2012-08-28 DIAGNOSIS — N838 Other noninflammatory disorders of ovary, fallopian tube and broad ligament: Secondary | ICD-10-CM | POA: Diagnosis present

## 2012-08-28 DIAGNOSIS — N949 Unspecified condition associated with female genital organs and menstrual cycle: Secondary | ICD-10-CM | POA: Diagnosis present

## 2012-08-28 DIAGNOSIS — N840 Polyp of corpus uteri: Secondary | ICD-10-CM | POA: Diagnosis present

## 2012-08-28 DIAGNOSIS — D251 Intramural leiomyoma of uterus: Secondary | ICD-10-CM | POA: Diagnosis present

## 2012-08-28 DIAGNOSIS — Z90710 Acquired absence of both cervix and uterus: Secondary | ICD-10-CM

## 2012-08-28 DIAGNOSIS — N8000 Endometriosis of the uterus, unspecified: Secondary | ICD-10-CM | POA: Diagnosis present

## 2012-08-28 DIAGNOSIS — D252 Subserosal leiomyoma of uterus: Secondary | ICD-10-CM | POA: Diagnosis present

## 2012-08-28 HISTORY — PX: ABDOMINAL HYSTERECTOMY: SHX81

## 2012-08-28 SURGERY — HYSTERECTOMY, ABDOMINAL
Anesthesia: General | Site: Abdomen | Wound class: Clean Contaminated

## 2012-08-28 MED ORDER — GLYCOPYRROLATE 0.2 MG/ML IJ SOLN
INTRAMUSCULAR | Status: AC
  Filled 2012-08-28: qty 2

## 2012-08-28 MED ORDER — FENTANYL CITRATE 0.05 MG/ML IJ SOLN
INTRAMUSCULAR | Status: AC
  Filled 2012-08-28: qty 5

## 2012-08-28 MED ORDER — HYDROMORPHONE HCL PF 1 MG/ML IJ SOLN: 0.2000 mg | INTRAMUSCULAR | Status: DC | PRN

## 2012-08-28 MED ORDER — LIDOCAINE HCL (CARDIAC) 20 MG/ML IV SOLN
INTRAVENOUS | Status: AC
  Filled 2012-08-28: qty 5

## 2012-08-28 MED ORDER — ZOLPIDEM TARTRATE 5 MG PO TABS: 5.0000 mg | ORAL_TABLET | Freq: Every evening | ORAL | Status: DC | PRN

## 2012-08-28 MED ORDER — FENTANYL CITRATE 0.05 MG/ML IJ SOLN
INTRAMUSCULAR | Status: DC | PRN
  Administered 2012-08-28: 100 ug via INTRAVENOUS
  Administered 2012-08-28: 150 ug via INTRAVENOUS

## 2012-08-28 MED ORDER — MENTHOL 3 MG MT LOZG: 1.0000 | LOZENGE | OROMUCOSAL | Status: DC | PRN

## 2012-08-28 MED ORDER — DIPHENHYDRAMINE HCL 50 MG/ML IJ SOLN: 12.5000 mg | Freq: Four times a day (QID) | INTRAMUSCULAR | Status: DC | PRN

## 2012-08-28 MED ORDER — HYDROMORPHONE HCL PF 1 MG/ML IJ SOLN
INTRAMUSCULAR | Status: AC
  Filled 2012-08-28: qty 1

## 2012-08-28 MED ORDER — NEOSTIGMINE METHYLSULFATE 1 MG/ML IJ SOLN
INTRAMUSCULAR | Status: DC | PRN
  Administered 2012-08-28: 3 mg via INTRAVENOUS

## 2012-08-28 MED ORDER — KETOROLAC TROMETHAMINE 30 MG/ML IJ SOLN
15.0000 mg | Freq: Once | INTRAMUSCULAR | Status: AC | PRN
  Administered 2012-08-28: 30 mg via INTRAVENOUS

## 2012-08-28 MED ORDER — KETOROLAC TROMETHAMINE 30 MG/ML IJ SOLN
INTRAMUSCULAR | Status: AC
  Filled 2012-08-28: qty 1

## 2012-08-28 MED ORDER — MIDAZOLAM HCL 5 MG/5ML IJ SOLN
INTRAMUSCULAR | Status: DC | PRN
  Administered 2012-08-28: 2 mg via INTRAVENOUS

## 2012-08-28 MED ORDER — NALOXONE HCL 0.4 MG/ML IJ SOLN: 0.4000 mg | INTRAMUSCULAR | Status: DC | PRN

## 2012-08-28 MED ORDER — LIDOCAINE HCL (CARDIAC) 20 MG/ML IV SOLN
INTRAVENOUS | Status: DC | PRN
  Administered 2012-08-28: 80 mg via INTRAVENOUS

## 2012-08-28 MED ORDER — OXYCODONE-ACETAMINOPHEN 5-325 MG PO TABS
1.0000 | ORAL_TABLET | ORAL | Status: DC | PRN
  Administered 2012-08-29 – 2012-08-31 (×5): 1 via ORAL
  Filled 2012-08-28 (×5): qty 1

## 2012-08-28 MED ORDER — ROCURONIUM BROMIDE 50 MG/5ML IV SOLN
INTRAVENOUS | Status: AC
  Filled 2012-08-28: qty 1

## 2012-08-28 MED ORDER — ONDANSETRON HCL 4 MG/2ML IJ SOLN
4.0000 mg | Freq: Four times a day (QID) | INTRAMUSCULAR | Status: DC | PRN
  Administered 2012-08-28 – 2012-08-29 (×2): 4 mg via INTRAVENOUS
  Filled 2012-08-28 (×2): qty 2

## 2012-08-28 MED ORDER — ACETAMINOPHEN 160 MG/5ML PO SOLN: 975.0000 mg | Freq: Once | ORAL | Status: AC

## 2012-08-28 MED ORDER — ROCURONIUM BROMIDE 100 MG/10ML IV SOLN
INTRAVENOUS | Status: DC | PRN
  Administered 2012-08-28: 10 mg via INTRAVENOUS
  Administered 2012-08-28: 40 mg via INTRAVENOUS

## 2012-08-28 MED ORDER — NEOSTIGMINE METHYLSULFATE 1 MG/ML IJ SOLN
INTRAMUSCULAR | Status: AC
  Filled 2012-08-28: qty 1

## 2012-08-28 MED ORDER — HYDROMORPHONE 0.3 MG/ML IV SOLN
INTRAVENOUS | Status: DC
  Administered 2012-08-28: 11 mg via INTRAVENOUS
  Administered 2012-08-28: 0.67 mg via INTRAVENOUS
  Administered 2012-08-28: 1.59 mg via INTRAVENOUS
  Administered 2012-08-29: 0.6 mg via INTRAVENOUS
  Administered 2012-08-29: 0.999 mg via INTRAVENOUS
  Administered 2012-08-29: 8.36 mg via INTRAVENOUS
  Filled 2012-08-28: qty 25

## 2012-08-28 MED ORDER — DEXAMETHASONE SODIUM PHOSPHATE 10 MG/ML IJ SOLN
INTRAMUSCULAR | Status: DC | PRN
  Administered 2012-08-28: 10 mg via INTRAVENOUS

## 2012-08-28 MED ORDER — DEXAMETHASONE SODIUM PHOSPHATE 10 MG/ML IJ SOLN
INTRAMUSCULAR | Status: AC
  Filled 2012-08-28: qty 1

## 2012-08-28 MED ORDER — HYDROCHLOROTHIAZIDE 12.5 MG PO CAPS
12.5000 mg | ORAL_CAPSULE | Freq: Every day | ORAL | Status: DC
  Administered 2012-08-29 – 2012-08-31 (×3): 12.5 mg via ORAL
  Filled 2012-08-28 (×4): qty 1

## 2012-08-28 MED ORDER — IBUPROFEN 600 MG PO TABS
600.0000 mg | ORAL_TABLET | Freq: Four times a day (QID) | ORAL | Status: DC | PRN
  Administered 2012-08-29 – 2012-08-31 (×6): 600 mg via ORAL
  Filled 2012-08-28 (×6): qty 1

## 2012-08-28 MED ORDER — ONDANSETRON HCL 4 MG/2ML IJ SOLN
INTRAMUSCULAR | Status: DC | PRN
  Administered 2012-08-28: 4 mg via INTRAVENOUS

## 2012-08-28 MED ORDER — MIDAZOLAM HCL 2 MG/2ML IJ SOLN
INTRAMUSCULAR | Status: AC
  Filled 2012-08-28: qty 2

## 2012-08-28 MED ORDER — GLYCOPYRROLATE 0.2 MG/ML IJ SOLN
INTRAMUSCULAR | Status: DC | PRN
  Administered 2012-08-28: 0.4 mg via INTRAVENOUS

## 2012-08-28 MED ORDER — LACTATED RINGERS IV SOLN
INTRAVENOUS | Status: DC
  Administered 2012-08-28: 125 mL/h via INTRAVENOUS
  Administered 2012-08-28 (×2): via INTRAVENOUS

## 2012-08-28 MED ORDER — PROPOFOL 10 MG/ML IV EMUL
INTRAVENOUS | Status: AC
  Filled 2012-08-28: qty 20

## 2012-08-28 MED ORDER — DEXTROSE-NACL 5-0.45 % IV SOLN
INTRAVENOUS | Status: DC
  Administered 2012-08-29: 02:00:00 via INTRAVENOUS

## 2012-08-28 MED ORDER — PROPOFOL 10 MG/ML IV BOLUS
INTRAVENOUS | Status: DC | PRN
  Administered 2012-08-28: 170 mg via INTRAVENOUS

## 2012-08-28 MED ORDER — DIPHENHYDRAMINE HCL 12.5 MG/5ML PO ELIX: 12.5000 mg | ORAL_SOLUTION | Freq: Four times a day (QID) | ORAL | Status: DC | PRN

## 2012-08-28 MED ORDER — SODIUM CHLORIDE 0.9 % IJ SOLN: 9.0000 mL | INTRAMUSCULAR | Status: DC | PRN

## 2012-08-28 MED ORDER — ONDANSETRON HCL 4 MG/2ML IJ SOLN
INTRAMUSCULAR | Status: AC
  Filled 2012-08-28: qty 2

## 2012-08-28 MED ORDER — ACETAMINOPHEN 160 MG/5ML PO SOLN
ORAL | Status: AC
  Administered 2012-08-28: 975 mg via ORAL
  Filled 2012-08-28: qty 40.6

## 2012-08-28 MED ORDER — HYDROMORPHONE HCL PF 1 MG/ML IJ SOLN
0.2500 mg | INTRAMUSCULAR | Status: DC | PRN
  Administered 2012-08-28 (×3): 0.25 mg via INTRAVENOUS

## 2012-08-28 SURGICAL SUPPLY — 36 items
CANISTER SUCTION 2500CC (MISCELLANEOUS) ×2 IMPLANT
CELLS DAT CNTRL 66122 CELL SVR (MISCELLANEOUS) IMPLANT
CLOTH BEACON ORANGE TIMEOUT ST (SAFETY) ×2 IMPLANT
DECANTER SPIKE VIAL GLASS SM (MISCELLANEOUS) IMPLANT
DRSG OPSITE POSTOP 4X10 (GAUZE/BANDAGES/DRESSINGS) ×2 IMPLANT
ELECT LIGASURE LONG (ELECTRODE) ×2 IMPLANT
GLOVE BIO SURGEON STRL SZ8 (GLOVE) ×2 IMPLANT
GLOVE SURG ORTHO 8.0 STRL STRW (GLOVE) ×2 IMPLANT
GOWN PREVENTION PLUS LG XLONG (DISPOSABLE) ×4 IMPLANT
GOWN STRL REIN XL XLG (GOWN DISPOSABLE) ×2 IMPLANT
NEEDLE HYPO 25X1 1.5 SAFETY (NEEDLE) IMPLANT
NS IRRIG 1000ML POUR BTL (IV SOLUTION) ×2 IMPLANT
PACK ABDOMINAL GYN (CUSTOM PROCEDURE TRAY) ×2 IMPLANT
PAD OB MATERNITY 4.3X12.25 (PERSONAL CARE ITEMS) ×2 IMPLANT
PROTECTOR NERVE ULNAR (MISCELLANEOUS) ×2 IMPLANT
RETRACTOR WND ALEXIS 25 LRG (MISCELLANEOUS) IMPLANT
RTRCTR WOUND ALEXIS 18CM MED (MISCELLANEOUS)
RTRCTR WOUND ALEXIS 25CM LRG (MISCELLANEOUS)
SPONGE LAP 18X18 X RAY DECT (DISPOSABLE) ×4 IMPLANT
STAPLER VISISTAT 35W (STAPLE) ×2 IMPLANT
STRIP CLOSURE SKIN 1/4X4 (GAUZE/BANDAGES/DRESSINGS) IMPLANT
SUT CHROMIC 3 0 SH 27 (SUTURE) IMPLANT
SUT MNCRL 0 MO-4 VIOLET 18 CR (SUTURE) ×2 IMPLANT
SUT MNCRL 0 VIOLET 6X18 (SUTURE) ×1 IMPLANT
SUT MONOCRYL 0 6X18 (SUTURE) ×1
SUT MONOCRYL 0 MO 4 18  CR/8 (SUTURE) ×2
SUT PDS AB 0 CT1 27 (SUTURE) ×4 IMPLANT
SUT VIC AB 0 CT1 18XCR BRD8 (SUTURE) IMPLANT
SUT VIC AB 0 CT1 8-18 (SUTURE)
SUT VIC AB 1 CTX 36 (SUTURE)
SUT VIC AB 1 CTX36XBRD ANBCTRL (SUTURE) IMPLANT
SUT VIC AB 2-0 CT1 (SUTURE) ×2 IMPLANT
SYR CONTROL 10ML LL (SYRINGE) IMPLANT
TOWEL OR 17X24 6PK STRL BLUE (TOWEL DISPOSABLE) ×4 IMPLANT
TRAY FOLEY CATH 14FR (SET/KITS/TRAYS/PACK) ×2 IMPLANT
WATER STERILE IRR 1000ML POUR (IV SOLUTION) ×2 IMPLANT

## 2012-08-28 NOTE — Anesthesia Preprocedure Evaluation (Signed)
Anesthesia Evaluation  Patient identified by MRN, date of birth, ID band Patient awake    Reviewed: Allergy & Precautions, H&P , NPO status , Patient's Chart, lab work & pertinent test results, reviewed documented beta blocker date and time   History of Anesthesia Complications Negative for: history of anesthetic complications  Airway Mallampati: II TM Distance: >3 FB Neck ROM: full    Dental  (+) Teeth Intact,    Pulmonary neg pulmonary ROS,  breath sounds clear to auscultation  Pulmonary exam normal       Cardiovascular Exercise Tolerance: Good hypertension (on HCTZ), On Medications Rhythm:regular Rate:Normal     Neuro/Psych negative neurological ROS  negative psych ROS   GI/Hepatic negative GI ROS, Neg liver ROS,   Endo/Other  negative endocrine ROS  Renal/GU negative Renal ROS  Female GU complaint     Musculoskeletal   Abdominal   Peds  Hematology negative hematology ROS (+)   Anesthesia Other Findings   Reproductive/Obstetrics negative OB ROS                           Anesthesia Physical Anesthesia Plan  ASA: II  Anesthesia Plan: General ETT   Post-op Pain Management:    Induction:   Airway Management Planned:   Additional Equipment:   Intra-op Plan:   Post-operative Plan:   Informed Consent: I have reviewed the patients History and Physical, chart, labs and discussed the procedure including the risks, benefits and alternatives for the proposed anesthesia with the patient or authorized representative who has indicated his/her understanding and acceptance.   Dental Advisory Given  Plan Discussed with: CRNA and Surgeon  Anesthesia Plan Comments:         Anesthesia Quick Evaluation

## 2012-08-28 NOTE — Op Note (Signed)
NAMECAROLAN, AVEDISIAN                ACCOUNT NO.:  [id number]  MEDICAL RECORD NO.:  [id number]  LOCATION:  [facility]                          FACILITY:  [facility]  PHYSICIAN:  Dineen Kid. Rana Snare, M.D.    DATE OF BIRTH:  01-Dec-1965  DATE OF PROCEDURE: DATE OF DISCHARGE:                              OPERATIVE REPORT   PREOPERATIVE DIAGNOSIS:  Abnormal uterine bleeding, menorrhagia, and 12 week size fibroids, and pelvic pain.  POSTOPERATIVE DIAGNOSIS:  Abnormal uterine bleeding, menorrhagia, and 12 week size fibroids, and pelvic pain.  PROCEDURE:  Total abdominal hysterectomy with left salpingectomy.  SURGEON:  Dineen Kid. Rana Snare, MD  ASSISTANT:  Stann Mainland. Vincente Poli, MD  ANESTHESIA:  General endotracheal.  INDICATIONS:  Ms. Taylor Robbins has been having worsening menorrhagia and pelvic pain.  She does have a history of fibroids with 2 previous myomectomies, 2 cesarean sections, and a tubal ligation at a point now where she wants something definitively done due to 12 week size fibroid Uterus,she wants to proceed with total abdominal hysterectomy, preservation of the ovaries if at all possible.  FINDINGS AT THE TIME OF SURGERY:  Left fallopian tube was densely adhered to the pelvic sidewall.  The uterus did have a moderate amount of adhesions to the omentum and the anterior abdominal wall and was otherwise 12-week size and globular consistent with fibroids.  DESCRIPTION OF PROCEDURE:  After adequate analgesia, the patient was placed in a supine position.  She was sterilely prepped and draped. Bladder sterilely drained with a Foley catheter.  The previous Pfannenstiel skin incision was taken down sharply.  Fascia was incised transversely, extended superiorly and inferiorly off the bellies rectus muscle which was separated sharply.  Peritoneum was entered sharply and carefully dissected to avoid injury to underlying bowel.  The adhesions from the uterus to the anterior abdominal wall were sharply dissected  using Bovie with care taken to avoid the underlying bowel.  After the uterus was freed from the omentum, it was elevated with a tenaculum through the incision and the bowel was packed using a wet pack.  Kelly clamps placed across the utero- ovarian ligaments bilaterally.  A LigaSure was used to ligate across the utero-ovarian ligament on the left and the right side dissecting down to the uterine vasculature.  The left fallopian tube was adhered to the pelvic sidewall and appeared to be devascularized.  The salpingectomy was carried out using the LigaSure across the mesosalpinx.  The fallopian tube was removed and good hemostasis achieved.  The bladder was then dissected off the anterior surface of the cervix which was dissected out of the uterine vasculature and the LigaSure was placed across the uterine vessels bilaterally and dissected down the cardinal ligaments down through the uterosacral ligaments.  Again, the bladder was carefully taken off the anterior surface to avoid injury to the bladder.  Heaney clamps were placed across the uterosacral ligaments bilaterally and completely across the vagina.  The uterus and cervix were removed intact revealing complete removal of the cervix.  Figure-of- eight 0 Monocryl suture were placed across the vagina and across uterosacral ligaments as the clamps were removed with good closure noted and good approximation and good hemostasis.  Another stitch plicated the uterosacral ligaments in the midline and also closing the peritoneum on the posterior side of the uterosacral ligaments with good support noted. Irrigation was then applied and after adequate hemostasis, the right ovary was attached to the round ligament.  The left ovary appeared to have good support.  No injuries to bowel or ureters were noticed.  Good peristalsis was noticed of the ureters bilaterally.  After copious amount of irrigation, adequate hemostasis was assured.  The pack  was removed.  Peritoneum closed with 0 Monocryl suture.  Rectus muscles were plicated in midline.  Irrigation applied after adequate hemostasis.  The fascia was closed using 2 sutures 0 PDS in running fashion.  Irrigation applied after adequate hemostasis.  The skin was stapled. The patient tolerated the procedure well and was stable to the PACU. Sponge and needle count was normal x3.  Stable chest of restenosis count was normal x3.  Estimated blood loss was 150 mL.  The patient received 2 g of cefotetan preoperatively.     Dineen Kid Rana Snare, M.D.     DCL/MEDQ  D:  08/28/2012  T:  08/28/2012  Job:  [id number]

## 2012-08-28 NOTE — Brief Op Note (Signed)
08/28/2012  8:44 AM  PATIENT:  Taylor Robbins  47 y.o. female  PRE-OPERATIVE DIAGNOSIS:  12 week size fibroids, pelvic pain, menorrhagia cpt 58150  POST-OPERATIVE DIAGNOSIS:  12 week size fibroids, pelvic pain, menorrhagia cpt 58150  PROCEDURE:  Procedure(s): HYSTERECTOMY ABDOMINAL (N/A) , left salpingectomy  SURGEON:  Surgeon(s) and Role:    * Turner Daniels, MD - Primary  PHYSICIAN ASSISTANT:   ASSISTANTS: Grewal   ANESTHESIA:   general  EBL:  Total I/O In: 1000 [I.V.:1000] Out: 275 [Urine:125; Blood:150]  BLOOD ADMINISTERED:none  DRAINS: Urinary Catheter (Foley)   LOCAL MEDICATIONS USED:  NONE  SPECIMEN:  Source of Specimen:  Uterus and left fallopian tube  DISPOSITION OF SPECIMEN:  PATHOLOGY  COUNTS:  YES  TOURNIQUET:  * No tourniquets in log *  DICTATION: .Other Dictation: Dictation Number 1  PLAN OF CARE: Admit to inpatient   PATIENT DISPOSITION:  PACU - hemodynamically stable.   Delay start of Pharmacological VTE agent (>24hrs) due to surgical blood loss or risk of bleeding: not applicable

## 2012-08-28 NOTE — Op Note (Signed)
NAMEMICHALLA, RINGER                ACCOUNT NO.:  [id number]  MEDICAL RECORD NO.:  [id number]  LOCATION:  [facility]                          [facility]ACILITY[facility]  [facility]  PHYSICIAN:  Dineen Kid. Rana Snare, M.D.    DATE OF BIRTH:  Jul 29, 1965  DATE OF PROCEDURE:  08/28/2012 DATE OF DISCHARGE:                              OPERATIVE REPORT   PREOPERATIVE DIAGNOSIS:  Menorrhagia and pelvic pain, abnormal uterine bleeding, and 12 week size fibroid uterus.  POSTOPERATIVE DIAGNOSIS:  Menorrhagia and pelvic pain, abnormal uterine bleeding, and 12 week size fibroid uterus.  PROCEDURE:  Total abdominal hysterectomy and salpingectomy.  SURGEON:  Dineen Kid. Rana Snare, MD  ASSISTANT:  Stann Mainland. Vincente Poli, MD  ANESTHESIA:  General endotracheal.  INDICATIONS:  Ms. Ruedas has had 2 previous cesarean sections, 2 previous myomectomies for fibroids.  She has also had a tubal ligation. She is having worsening problems with pelvic pain, pressure, menorrhagia and abnormal uterine bleeding, and a 12-week size fibroid.  She desires definitive surgical management with hysterectomy.  Plan abdominal hysterectomy due to her history of previous abdominal surgeries for the potential of scar tissue and also a very narrow pelvis.  Risks and benefits of the procedure were discussed at length.  Informed consent was obtained.  FINDINGS:  At time of surgery 12 week size globular fibroid uterus. Moderate amount of scarring from the omentum to the anterior abdominal wall in the left __________  DICTATION ENDS HERE     Dineen Kid. Rana Snare, M.D.     DCL/MEDQ  D:  08/28/2012  T:  08/28/2012  Job:  [id number]

## 2012-08-28 NOTE — Transfer of Care (Signed)
Immediate Anesthesia Transfer of Care Note  Patient: Taylor Robbins  Procedure(s) Performed: Procedure(s): HYSTERECTOMY ABDOMINAL (N/A)  Patient Location: PACU  Anesthesia Type:General  Level of Consciousness: sedated  Airway & Oxygen Therapy: Patient Spontanous Breathing and Patient connected to nasal cannula oxygen  Post-op Assessment: Report given to PACU RN and Post -op Vital signs reviewed and stable  Post vital signs: stable  Complications: No apparent anesthesia complications

## 2012-08-28 NOTE — Anesthesia Postprocedure Evaluation (Signed)
  Anesthesia Post-op Note  Patient: Taylor Robbins  Procedure(s) Performed: Procedure(s): HYSTERECTOMY ABDOMINAL (N/A)  Patient Location: PACU  Anesthesia Type:General  Level of Consciousness: awake, alert  and oriented  Airway and Oxygen Therapy: Patient Spontanous Breathing  Post-op Pain: mild  Post-op Assessment: Post-op Vital signs reviewed, Patient's Cardiovascular Status Stable, Respiratory Function Stable, Patent Airway, No signs of Nausea or vomiting and Pain level controlled  Post-op Vital Signs: Reviewed and stable  Complications: No apparent anesthesia complications

## 2012-08-28 NOTE — Anesthesia Postprocedure Evaluation (Signed)
  Anesthesia Post-op Note  Patient: Taylor Robbins  Procedure(s) Performed: Procedure(s): HYSTERECTOMY ABDOMINAL (N/A)  Patient Location: Mother/Baby  Anesthesia Type:General  Level of Consciousness: awake  Airway and Oxygen Therapy: Patient Spontanous Breathing  Post-op Pain: mild  Post-op Assessment: Patient's Cardiovascular Status Stable and Respiratory Function Stable  Post-op Vital Signs: stable  Complications: No apparent anesthesia complications

## 2012-08-29 ENCOUNTER — Encounter (HOSPITAL_COMMUNITY): Payer: Self-pay | Admitting: Obstetrics and Gynecology

## 2012-08-29 LAB — CBC
MCV: 77.5 fL — ABNORMAL LOW (ref 78.0–100.0)
Platelets: 202 10*3/uL (ref 150–400)
RBC: 3.69 MIL/uL — ABNORMAL LOW (ref 3.87–5.11)
RDW: 14 % (ref 11.5–15.5)
WBC: 11.1 10*3/uL — ABNORMAL HIGH (ref 4.0–10.5)

## 2012-08-29 MED ORDER — BISACODYL 10 MG RE SUPP: 10.0000 mg | Freq: Every day | RECTAL | Status: DC | PRN

## 2012-08-29 NOTE — Progress Notes (Signed)
1 Day Post-Op Procedure(s) (LRB): HYSTERECTOMY ABDOMINAL (N/A)  Subjective: Patient reports tolerating PO and no problems voiding.    Objective: I have reviewed patient's vital signs, intake and output and labs.  General: alert, cooperative, appears stated age and no distress GI: soft, non-tender; bowel sounds normal; no masses,  no organomegaly and incision: clean, dry and intact Vaginal Bleeding: none  Assessment: s/p Procedure(s): HYSTERECTOMY ABDOMINAL (N/A): stable  Plan: Advance diet Encourage ambulation Advance to PO medication Discontinue IV fluids  LOS: 1 day    Taylor Robbins C 08/29/2012, 9:56 AM

## 2012-08-30 NOTE — Progress Notes (Signed)
2 Days Post-Op Procedure(s) (LRB): HYSTERECTOMY ABDOMINAL (N/A)  Subjective: Patient reports incisional pain, tolerating PO, + flatus and no problems voiding.    Objective: I have reviewed patient's vital signs, intake and output and medications.  General: alert, cooperative, appears stated age and mild distress GI: soft, non-tender; bowel sounds normal; no masses,  no organomegaly and incision: clean, dry and intact  Assessment: s/p Procedure(s): HYSTERECTOMY ABDOMINAL (N/A): stable, progressing well and tolerating diet  Plan: Advance diet Encourage ambulation Advance to PO medication Discontinue IV fluids  LOS: 2 days    Laporscha Linehan C 08/30/2012, 10:11 AM

## 2012-08-31 MED ORDER — OXYCODONE-ACETAMINOPHEN 5-325 MG PO TABS: 1.0000 | ORAL_TABLET | ORAL | Status: DC | PRN

## 2012-08-31 MED ORDER — IBUPROFEN 600 MG PO TABS: 600.0000 mg | ORAL_TABLET | Freq: Four times a day (QID) | ORAL | Status: DC | PRN

## 2012-08-31 NOTE — Discharge Summary (Addendum)
Physician Discharge Summary  Patient ID: Taylor Robbins MRN: [id number] DOB/AGE: [birth year] 47 y.o.  Admit date: 08/28/2012 Discharge date: 08/31/2012  Admission Diagnoses:Menorrhagia and pelvic pain, abnormal uterine  bleeding, and 12 week size fibroid uterus   Discharge Diagnoses: Menorrhagia and pelvic pain, abnormal uterine  bleeding, and 12 week size fibroid uterus  Active Problems:   * No active hospital problems. *   Discharged Condition: good  Hospital Course: Pt underwent uncomplicated TAH with left salpingectomy.  Her post op care was unremarkable with progression to full diet, ambulation, oral pain meds.  POD 1 Hgb 9.7.  By POD 3 passing flatus, tolerating po well and pain well controlled and was d/cd home   Consults: None  Significant Diagnostic Studies: labs: hgb 9.7  Treatments: surgery: TAH  Discharge Exam: Blood pressure 120/83, pulse 70, temperature 97.9 F (36.6 C), temperature source Oral, resp. rate 18, height 5\' 6"  (1.676 m), weight 93.895 kg (207 lb), SpO2 97.00%. General appearance: alert, cooperative, appears stated age and no distress GI: soft, non-tender; bowel sounds normal; no masses,  no organomegaly Incision/Wound:CD&I  Disposition: Final discharge disposition not confirmed  Discharge Orders   Future Orders Complete By Expires     Apply steri strips  As directed     Call MD for:  difficulty breathing, headache or visual disturbances  As directed     Call MD for:  persistant nausea and vomiting  As directed     Call MD for:  redness, tenderness, or signs of infection (pain, swelling, redness, odor or green/yellow discharge around incision site)  As directed     Call MD for:  severe uncontrolled pain  As directed     Call MD for:  temperature >100.4  As directed     Diet general  As directed     Discharge instructions  As directed     Comments:      Light activity No heavy lifting No driving for 2 weeks Call for incision check in 1-2 weeks     Driving Restrictions  As directed     Comments:      No driving for 2 weeks    Increase activity slowly  As directed     Lifting restrictions  As directed     Comments:      No lifting anything greater than 10 pounds (if you have to ask, don't lift it)    Remove staples  As directed     Sexual Activity Restrictions  As directed     Comments:      Nothing in the vagina for 6 weeks        Medication List    TAKE these medications       cholecalciferol 1000 UNITS tablet  Commonly known as:  VITAMIN D  Take 1,000 Units by mouth daily.     ECHINACEA PO  Take 1 capsule by mouth daily. For immune health     FLAX SEED OIL PO  Take 1 capsule by mouth daily.     hydrochlorothiazide 12.5 MG capsule  Commonly known as:  MICROZIDE  Take 12.5 mg by mouth daily.     ibuprofen 600 MG tablet  Commonly known as:  ADVIL,MOTRIN  Take 1 tablet (600 mg total) by mouth every 6 (six) hours as needed (mild pain).     ibuprofen 200 MG tablet  Commonly known as:  ADVIL,MOTRIN  Take 200 mg by mouth every 6 (six) hours as needed for pain or headache.  IRON PO  Take 1 tablet by mouth daily.     MAGNESIUM PO  Take 1 tablet by mouth daily.     oxyCODONE-acetaminophen 5-325 MG per tablet  Commonly known as:  PERCOCET/ROXICET  Take 1-2 tablets by mouth every 4 (four) hours as needed.         Signed: [name] 08/31/2012, 8:59 AM

## 2012-08-31 NOTE — Progress Notes (Signed)
Discharge instructions provided to patient at bedside.  Follow up instructions, incision care, medications, activity, when to call the doctor and community resources discussed.  No questions at this time.  Patient left unit in stable condition with all personal belongings in stable condition accompanied by staff.  Osvaldo Angst, RN------------------

## 2012-08-31 NOTE — Progress Notes (Signed)
3 Days Post-Op Procedure(s) (LRB): HYSTERECTOMY ABDOMINAL (N/A)  Subjective: Patient reports tolerating PO, + flatus and no problems voiding.    Objective: I have reviewed patient's vital signs, intake and output, medications and labs.  General: alert, cooperative, appears stated age and no distress GI: soft, non-tender; bowel sounds normal; no masses,  no organomegaly and incision: clean, dry and intact  Assessment: s/p Procedure(s): HYSTERECTOMY ABDOMINAL (N/A): stable and progressing well  Plan: Discharge home  LOS: 3 days    Eliette Drumwright C 08/31/2012, 8:54 AM

## 2012-09-27 ENCOUNTER — Telehealth: Payer: Self-pay | Admitting: Internal Medicine

## 2012-09-27 NOTE — Telephone Encounter (Signed)
Pt needs a refill on her HCTZ 12.5mg  to [facility] [address]

## 2012-09-28 ENCOUNTER — Other Ambulatory Visit: Payer: Self-pay

## 2012-09-28 MED ORDER — HYDROCHLOROTHIAZIDE 12.5 MG PO CAPS: 12.5000 mg | ORAL_CAPSULE | Freq: Every day | ORAL | Status: DC

## 2012-09-28 NOTE — Telephone Encounter (Signed)
B/P MED SENT IN

## 2012-11-07 ENCOUNTER — Ambulatory Visit (INDEPENDENT_AMBULATORY_CARE_PROVIDER_SITE_OTHER): Payer: BC Managed Care – PPO | Admitting: Medical

## 2012-11-07 ENCOUNTER — Encounter: Payer: Self-pay | Admitting: Medical

## 2012-11-07 VITALS — BP 124/84 | HR 68 | Temp 97.9°F | Resp 16 | Wt 210.0 lb

## 2012-11-07 DIAGNOSIS — I10 Essential (primary) hypertension: Secondary | ICD-10-CM

## 2012-11-07 DIAGNOSIS — H8110 Benign paroxysmal vertigo, unspecified ear: Secondary | ICD-10-CM

## 2012-11-07 MED ORDER — MECLIZINE HCL 25 MG PO TABS: 25.0000 mg | ORAL_TABLET | Freq: Three times a day (TID) | ORAL | Status: DC | PRN

## 2012-11-07 MED ORDER — HYDROCHLOROTHIAZIDE 12.5 MG PO CAPS: 12.5000 mg | ORAL_CAPSULE | Freq: Every day | ORAL | Status: DC

## 2012-11-07 NOTE — Progress Notes (Signed)
Subjective:  Taylor Robbins is a 47 y.o. female who presents for vertigo.  Started few days ago with intermittent dizziness, room spinning feeling.   Been feeling ear popping, a little sinus congestion.  Had similar about a year ago, did well with meclizine.  Denies fever, cough, sore throat, NV, no headache.  Using nothing for symptoms . Denies sick contacts.  No other aggravating or relieving factors.  No other c/o.  Here for follow-up of hypertension.  She is exercising, is adherent to a low-salt diet.  Blood pressure is well controlled at home.  Cardiac symptoms: none. Patient denies: chest pain, dyspnea, fatigue, lower extremity edema and palpitations. Cardiovascular risk factors: hypertension. Use of agents associated with hypertension: none. History of target organ damage: none.   She was having problems with anemia, but this has resolved since hysterectomy few months ago.   ROS as in subjective.  Past Medical History  Diagnosis Date  . Hypertension in pregnancy   . Anemia   . History of blood transfusion     remote hx/o  . Uterine fibroid   . Nearsightedness     wears glasses  . Hypertension     Objective: Filed Vitals:   11/07/12 1641  BP: 124/84  Pulse: 68  Temp: 97.9 F (36.6 C)  Resp: 16    General appearance: Alert, WD/WN, no distress                             Skin: warm, no rash                           Head: no sinus tenderness                            Eyes: conjunctiva normal, corneas clear, PERRLA                            Ears: pearly TMs, external ear canals normal                          Nose: septum midline,no erythema or discharge             Mouth/throat: MMM, tongue normal, no pharyngeal erythema                           Neck: supple, no adenopathy, no thyromegaly, nontender                          Heart: RRR, normal S1, S2, no murmurs                         Lungs: CTA bilaterally, no wheezes, rales, or rhonchi     Assessment: Encounter  Diagnoses  Name Primary?  . Essential hypertension, benign Yes  . BPPV (benign paroxysmal positional vertigo)      Plan: HTN - doing well on low dose HCTZ.  Refills today.  Reviewed recent labs. BPPV - begin trial of Meclizine, rest, hydration.  Call/return if worse or not improving.

## 2013-01-17 ENCOUNTER — Ambulatory Visit (INDEPENDENT_AMBULATORY_CARE_PROVIDER_SITE_OTHER): Payer: BC Managed Care – PPO | Admitting: Medical

## 2013-01-17 ENCOUNTER — Encounter: Payer: Self-pay | Admitting: Medical

## 2013-01-17 VITALS — BP 120/80 | HR 72 | Temp 98.3°F | Resp 16 | Wt 210.0 lb

## 2013-01-17 DIAGNOSIS — J209 Acute bronchitis, unspecified: Secondary | ICD-10-CM

## 2013-01-17 MED ORDER — AZITHROMYCIN 250 MG PO TABS: ORAL_TABLET | ORAL | Status: DC

## 2013-01-17 MED ORDER — HYDROCODONE-HOMATROPINE 5-1.5 MG/5ML PO SYRP: 5.0000 mL | ORAL_SOLUTION | Freq: Three times a day (TID) | ORAL | Status: DC | PRN

## 2013-01-17 NOTE — Progress Notes (Signed)
Subjective:  Taylor Robbins is a 47 y.o. female who presents for cough.  Symptoms include 6 day hx/o hoarse voice, sore throat, cough that developed few days later, stuffy nostrils, sneezing, SOB with exertion, ear fullness, and yesterday had fever of 99 degrees.   Denies NVD, sinus pressure, chest pain.  Treatment to date: theraflu yesterday.  No sick contacts.   She does not smoke.   She does not have a history of lung disease.   No other aggravating or relieving factors.  No other c/o.  The following portions of the patient's history were reviewed and updated as appropriate: allergies, current medications, past family history, past medical history, past social history, past surgical history and problem list.  ROS as in subjective  Past Medical History  Diagnosis Date  . Hypertension in pregnancy   . Anemia   . History of blood transfusion     remote hx/o  . Uterine fibroid   . Nearsightedness     wears glasses  . Hypertension      Objective: Vital signs reviewed  General appearance: Alert, WD/WN, no distress, ill appearing                             Skin: warm, no rash, no diaphoresis                           Head: no sinus tenderness                            Eyes: conjunctiva normal, corneas clear, PERRLA                            Ears: pearly TMs, external ear canals normal                          Nose: septum midline, turbinates swollen, with erythema and no discharge             Mouth/throat: MMM, tongue normal, mild pharyngeal erythema                           Neck: supple, no adenopathy, no thyromegaly, nontender                          Heart: RRR, normal S1, S2, no murmurs                         Lungs: +bronchial breath sounds, no rhonchi, no wheezes, no rales                Extremities: no edema, nontender     Assessment: Encounter Diagnosis  Name Primary?  . Acute bronchitis Yes    Plan:  Medication orders today include: zpak, hycodan syrup.  Discussed  diagnosis and treatment of bronchitis.  Suggested symptomatic OTC remedies for cough and congestion.  Tylenol or Ibuprofen OTC for fever and malaise.  Call/return in 2-3 days if symptoms are worse or not improving.

## 2013-05-09 ENCOUNTER — Ambulatory Visit (INDEPENDENT_AMBULATORY_CARE_PROVIDER_SITE_OTHER): Payer: BC Managed Care – PPO | Admitting: Medical

## 2013-05-09 ENCOUNTER — Encounter: Payer: Self-pay | Admitting: Medical

## 2013-05-09 VITALS — BP 138/90 | HR 80 | Temp 97.7°F | Resp 16 | Wt 212.0 lb

## 2013-05-09 DIAGNOSIS — R05 Cough: Secondary | ICD-10-CM

## 2013-05-09 DIAGNOSIS — R059 Cough, unspecified: Secondary | ICD-10-CM

## 2013-05-09 DIAGNOSIS — R0982 Postnasal drip: Secondary | ICD-10-CM

## 2013-05-09 DIAGNOSIS — J329 Chronic sinusitis, unspecified: Secondary | ICD-10-CM

## 2013-05-09 MED ORDER — HYDROCODONE-HOMATROPINE 5-1.5 MG/5ML PO SYRP: 5.0000 mL | ORAL_SOLUTION | Freq: Three times a day (TID) | ORAL | Status: DC | PRN

## 2013-05-09 NOTE — Patient Instructions (Signed)
  Thank you for giving me the opportunity to serve you today.    Your diagnosis today includes: Encounter Diagnoses  Name Primary?  . Post-nasal drainage Yes  . Cough      Specific recommendations today include:  Begin OTC Benadryl at bedtime nightly for at least 2 weeks.     Consider Claritin allergy pill in the mornings  Since late March brings in spring, consider daily antihistamine the next few months  Use Hycodan syrup as needed for cough.  This may cause drowsiness  during the day, you can either use the Hycodan or OTC Delsym  Consider salt water gargles, warm fluids, drink plenty of water in general  Follow up: if not improving in 1-2 weeks.

## 2013-05-09 NOTE — Progress Notes (Signed)
Subjective:  Taylor Robbins is a 48 y.o. female who presents for cough.  Symptoms include 3 wk hx/o cough, some palpations of the heart, not productive, popping in the ears, some dyspnea.  Has slight head congestion.   Denies fever, wheezing, no leg edema, no sore throat, no teeth pain, no NVD.   Treatment to date: alka seltzer cold plus.  Kids at home sick 2 wk ago.   She does not smoke.  No other aggravating or relieving factors.  No other c/o.  The following portions of the patient's history were reviewed and updated as appropriate: allergies, current medications, past family history, past medical history, past social history, past surgical history and problem list.  ROS as in subjective  Past Medical History  Diagnosis Date  . Hypertension in pregnancy   . Anemia   . History of blood transfusion     remote hx/o  . Uterine fibroid   . Nearsightedness     wears glasses  . Hypertension      Objective: BP 138/90  Pulse 80  Temp(Src) 97.7 F (36.5 C) (Oral)  Resp 16  Wt 212 lb (96.163 kg)  General appearance: Alert, WD/WN, no distress                             Skin: warm, no rash, no diaphoresis                           Head: no sinus tenderness                            Eyes: conjunctiva normal, corneas clear, PERRLA                            Ears: pearly TMs, external ear canals normal                          Nose: septum midline, turbinates mildly swollen, with erythema but no discharge             Mouth/throat: MMM, tongue normal, +post nasal drainage                           Neck: supple, no adenopathy, no thyromegaly, nontender                          Heart: RRR, normal S1, S2, no murmurs                         Lungs: clear, no rhonchi, no wheezes, no rales                Extremities: no edema, nontender     Assessment: Encounter Diagnoses  Name Primary?  . Post-nasal drainage Yes  . Cough     Plan:  Medication orders today include: Hycodan  syrup.  Recommendations:  Begin OTC Benadryl at bedtime nightly for at least 2 weeks.     Consider Claritin allergy pill in the mornings  Since late March brings in spring, consider daily antihistamine the next few months  Use Hycodan syrup as needed for cough.  This may cause drowsiness  during the day, you can either use the  Hycodan or OTC Delsym  Consider salt water gargles, warm fluids, drink plenty of water in general  Call/return in 1-2 wk if symptoms are worse or not improving.

## 2013-06-13 ENCOUNTER — Ambulatory Visit (INDEPENDENT_AMBULATORY_CARE_PROVIDER_SITE_OTHER): Payer: BC Managed Care – PPO | Admitting: Medical

## 2013-06-13 ENCOUNTER — Encounter: Payer: Self-pay | Admitting: Medical

## 2013-06-13 VITALS — BP 112/70 | HR 80 | Temp 98.2°F | Wt 203.0 lb

## 2013-06-13 DIAGNOSIS — R05 Cough: Secondary | ICD-10-CM

## 2013-06-13 DIAGNOSIS — R059 Cough, unspecified: Secondary | ICD-10-CM

## 2013-06-13 MED ORDER — ALBUTEROL SULFATE HFA 108 (90 BASE) MCG/ACT IN AERS: 2.0000 | INHALATION_SPRAY | Freq: Four times a day (QID) | RESPIRATORY_TRACT | Status: AC | PRN

## 2013-06-13 MED ORDER — METHYLPREDNISOLONE (PAK) 4 MG PO TABS: ORAL_TABLET | ORAL | Status: DC

## 2013-06-13 NOTE — Patient Instructions (Signed)
Thank you for giving me the opportunity to serve you today.    Your diagnosis today includes: Encounter Diagnosis  Name Primary?  . Cough Yes     Specific recommendations today include:  For now, stop Benadryl, begin Zyrtec or Allegra at bedtime daily  If the hydrocodone syrup is not working then stop that medication  Begin Medrol steroid Dosepak as directed  Use albuterol inhaler, 2 puffs every 4-6 hours as needed for wheezing, shortness of breath, coughing spells  Follow up: with pulmonary function study in 2 weeks.  We will call with appoitment info.   I have included other useful information below for your review.  Asthma, Adult Asthma is a recurring condition in which the airways tighten and narrow. Asthma can make it difficult to breathe. It can cause coughing, wheezing, and shortness of breath. Asthma episodes (also called asthma attacks) range from minor to life-threatening. Asthma cannot be cured, but medicines and lifestyle changes can help control it. CAUSES Asthma is believed to be caused by inherited (genetic) and environmental factors, but its exact cause is unknown. Asthma may be triggered by allergens, lung infections, or irritants in the air. Asthma triggers are different for each person. Common triggers include:   Animal dander.  Dust mites.  Cockroaches.  Pollen from trees or grass.  Mold.  Smoke.  Air pollutants such as dust, household cleaners, hair sprays, aerosol sprays, paint fumes, strong chemicals, or strong odors.  Cold air, weather changes, and winds (which increase molds and pollens in the air).  Strong emotional expressions such as crying or laughing hard.  Stress.  Certain medicines (such as aspirin) or types of drugs (such as beta-blockers).  Sulfites in foods and drinks. Foods and drinks that may contain sulfites include dried fruit, potato chips, and sparkling grape juice.  Infections or inflammatory conditions such as the flu,  a cold, or an inflammation of the nasal membranes (rhinitis).  Gastroesophageal reflux disease (GERD).  Exercise or strenuous activity. SYMPTOMS Symptoms may occur immediately after asthma is triggered or many hours later. Symptoms include:  Wheezing.  Excessive nighttime or early morning coughing.  Frequent or severe coughing with a common cold.  Chest tightness.  Shortness of breath. DIAGNOSIS  The diagnosis of asthma is made by a review of your medical history and a physical exam. Tests may also be performed. These may include:  Lung function studies. These tests show how much air you breath in and out.  Allergy tests.  Imaging tests such as X-rays. TREATMENT  Asthma cannot be cured, but it can usually be controlled. Treatment involves identifying and avoiding your asthma triggers. It also involves medicines. There are 2 classes of medicine used for asthma treatment:   Controller medicines. These prevent asthma symptoms from occurring. They are usually taken every day.  Reliever or rescue medicines. These quickly relieve asthma symptoms. They are used as needed and provide short-term relief. Your health care provider will help you create an asthma action plan. An asthma action plan is a written plan for managing and treating your asthma attacks. It includes a list of your asthma triggers and how they may be avoided. It also includes information on when medicines should be taken and when their dosage should be changed. An action plan may also involve the use of a device called a peak flow meter. A peak flow meter measures how well the lungs are working. It helps you monitor your condition. HOME CARE INSTRUCTIONS   Take medicine as directed  by your health care provider. Speak with your health care provider if you have questions about how or when to take the medicines.  Use a peak flow meter as directed by your health care provider. Record and keep track of readings.  Understand  and use the action plan to help minimize or stop an asthma attack without needing to seek medical care.  Control your home environment in the following ways to help prevent asthma attacks:  Do not smoke. Avoid being exposed to secondhand smoke.  Change your heating and air conditioning filter regularly.  Limit your use of fireplaces and wood stoves.  Get rid of pests (such as roaches and mice) and their droppings.  Throw away plants if you see mold on them.  Clean your floors and dust regularly. Use unscented cleaning products.  Try to have someone else vacuum for you regularly. Stay out of rooms while they are being vacuumed and for a short while afterward. If you vacuum, use a dust mask from a hardware store, a double-layered or microfilter vacuum cleaner bag, or a vacuum cleaner with a HEPA filter.  Replace carpet with wood, tile, or vinyl flooring. Carpet can trap dander and dust.  Use allergy-proof pillows, mattress covers, and box spring covers.  Wash bed sheets and blankets every week in hot water and dry them in a dryer.  Use blankets that are made of polyester or cotton.  Clean bathrooms and kitchens with bleach. If possible, have someone repaint the walls in these rooms with mold-resistant paint. Keep out of the rooms that are being cleaned and painted.  Wash hands frequently. SEEK MEDICAL CARE IF:   You have wheezing, shortness of breath, or a cough even if taking medicine to prevent attacks.  The colored mucus you cough up (sputum) is thicker than usual.  Your sputum changes from clear or white to yellow, green, gray, or bloody.  You have any problems that may be related to the medicines you are taking (such as a rash, itching, swelling, or trouble breathing).  You are using a reliever medicine more than 2 3 times per week.  Your peak flow is still at 50 79% of you personal best after following your action plan for 1 hour. SEEK IMMEDIATE MEDICAL CARE IF:   You  seem to be getting worse and are unresponsive to treatment during an asthma attack.  You are short of breath even at rest.  You get short of breath when doing very little physical activity.  You have difficulty eating, drinking, or talking due to asthma symptoms.  You develop chest pain.  You develop a fast heartbeat.  You have a bluish color to your lips or fingernails.  You are lightheaded, dizzy, or faint.  Your peak flow is less than 50% of your personal best.  You have a fever or persistent symptoms for more than 2 3 days.  You have a fever and symptoms suddenly get worse. MAKE SURE YOU:   Understand these instructions.  Will watch your condition.  Will get help right away if you are not doing well or get worse. Document Released: 03/01/2005 Document Revised: 11/01/2012 Document Reviewed: 09/28/2012 [facility] Patient Information 2014 [place], [place].

## 2013-06-13 NOTE — Addendum Note (Signed)
Addended by: Clyde Lundborg A on: 06/13/2013 02:22 PM   Modules accepted: Orders

## 2013-06-13 NOTE — Progress Notes (Signed)
Subjective:  Taylor Robbins is a 48 y.o. female who presents for cough.  I just saw her recently for the same and she is not improved.   Using the Benadryl and hydrocodone but this just makes her sleepy and has not helping the cough.  Current symptoms are cough spells, sometimes SOB from cough spells.   No runny nose, no fever, itchy eyes, no chest congestion, productive sputum, no sneezing, no heartburn.  No hx/o asthma, no current GERD symptoms, no animals in the home. She does live in an older home, possibly mold in the basement.    At her last visit, symptoms included 3 wk hx/o cough, some palpations of the heart, not productive, popping in the ears, some dyspnea.  Has slight head congestion.   Denies fever, wheezing, no leg edema, no sore throat, no teeth pain, no NVD.  She does not smoke.  No other aggravating or relieving factors.  No other c/o.  The following portions of the patient's history were reviewed and updated as appropriate: allergies, current medications, past family history, past medical history, past social history, past surgical history and problem list.  ROS as in subjective  Past Medical History  Diagnosis Date  . Hypertension in pregnancy   . Anemia   . History of blood transfusion     remote hx/o  . Uterine fibroid   . Nearsightedness     wears glasses  . Hypertension      Objective: BP 112/70  Pulse 80  Temp(Src) 98.2 F (36.8 C)  Wt 203 lb (92.08 kg)  General appearance: Alert, WD/WN, no distress                             Skin: warm, no rash, no diaphoresis                           Head: no sinus tenderness                            Eyes: conjunctiva normal, corneas clear, PERRLA                            Ears: pearly TMs, external ear canals normal                          Nose: septum midline, turbinates mildly swollen, no erythema or discharge             Mouth/throat: MMM, tongue normal, +post nasal drainage                           Neck:  supple, no adenopathy, no thyromegaly, nontender                          Heart: RRR, normal S1, S2, no murmurs                         Lungs: clear, no rhonchi, no wheezes, no rales                Extremities: no edema, nontender     Assessment: Encounter Diagnosis  Name Primary?  . Cough Yes    Plan:  We discussed  her cough symptoms, suspect asthma or allergic asthma.  Pulse ox 98% on room air.  We tried a peak flow today and she had trouble getting over 200.  She is unable ago for PFTs or chest x-ray now given that she's going out of town today  Specific recommendations today include:  For now, stop Benadryl, begin Zyrtec or Allegra at bedtime daily  If the hydrocodone syrup is not working then stop that medication  Begin Medrol steroid Dosepak as directed  Use albuterol inhaler, 2 puffs every 4-6 hours as needed for wheezing, shortness of breath, coughing spells  Follow up: with pulmonary function study in 2 weeks.  We will call with appoitment info.

## 2013-06-19 ENCOUNTER — Telehealth: Payer: Self-pay | Admitting: Family Medicine

## 2013-06-19 ENCOUNTER — Other Ambulatory Visit: Payer: Self-pay | Admitting: Family Medicine

## 2013-06-19 DIAGNOSIS — R059 Cough, unspecified: Secondary | ICD-10-CM

## 2013-06-19 DIAGNOSIS — R05 Cough: Secondary | ICD-10-CM

## 2013-06-19 NOTE — Telephone Encounter (Signed)
Message copied by Patrice Matthew L on Tue Jun 19, 2013  2:18 PM ------      Message from: Glade Lloyd, [place]: Wed Jun 13, 2013  2:06 PM       Refer for PFTs spirometry only, 2 weeks from now ------

## 2013-06-19 NOTE — Telephone Encounter (Signed)
PATIENT IS AWARE OF HER APPOINTMENT TO HAVE PFT'S DONE AT [facility] ON 07/12/13 @ 1000 AM. CLS [phone]

## 2013-07-12 ENCOUNTER — Ambulatory Visit (INDEPENDENT_AMBULATORY_CARE_PROVIDER_SITE_OTHER): Payer: BC Managed Care – PPO | Admitting: Internal Medicine

## 2013-07-12 ENCOUNTER — Encounter (INDEPENDENT_AMBULATORY_CARE_PROVIDER_SITE_OTHER): Payer: Self-pay

## 2013-07-12 DIAGNOSIS — R059 Cough, unspecified: Secondary | ICD-10-CM

## 2013-07-12 DIAGNOSIS — R05 Cough: Secondary | ICD-10-CM

## 2013-07-12 NOTE — Progress Notes (Signed)
PFT done today. 

## 2013-07-15 LAB — PULMONARY FUNCTION TEST
DL/VA % pred: 71 %
DL/VA: 3.62 ml/min/mmHg/L
DLCO UNC % PRED: 83 %
DLCO unc: 22.42 ml/min/mmHg
FEF 25-75 PRE: 3.79 L/s
FEF 25-75 Post: 4.19 L/sec
FEF2575-%CHANGE-POST: 10 %
FEF2575-%PRED-POST: 156 %
FEF2575-%PRED-PRE: 141 %
FEV1-%Change-Post: 1 %
FEV1-%PRED-PRE: 120 %
FEV1-%Pred-Post: 121 %
FEV1-PRE: 3.08 L
FEV1-Post: 3.11 L
FEV1FVC-%Change-Post: 0 %
FEV1FVC-%PRED-PRE: 103 %
FEV6-%CHANGE-POST: 0 %
FEV6-%PRED-POST: 118 %
FEV6-%Pred-Pre: 117 %
FEV6-Post: 3.64 L
FEV6-Pre: 3.61 L
FEV6FVC-%Change-Post: 0 %
FEV6FVC-%Pred-Post: 103 %
FEV6FVC-%Pred-Pre: 103 %
FVC-%Change-Post: 0 %
FVC-%PRED-POST: 115 %
FVC-%Pred-Pre: 114 %
FVC-POST: 3.64 L
FVC-Pre: 3.63 L
POST FEV1/FVC RATIO: 86 %
Post FEV6/FVC ratio: 100 %
Pre FEV1/FVC ratio: 85 %
Pre FEV6/FVC Ratio: 100 %
RV % pred: 48 %
RV: 0.89 L
TLC % pred: 86 %
TLC: 4.63 L

## 2013-07-16 ENCOUNTER — Telehealth: Payer: Self-pay | Admitting: Medical

## 2013-07-16 NOTE — Telephone Encounter (Signed)
PFT

## 2013-07-18 NOTE — Telephone Encounter (Signed)
Patient states that her cough and [facility]gle [facility] is aware. CLS She is aware of her PFT results. CLS

## 2013-07-18 NOTE — Telephone Encounter (Signed)
Please call and see how her cough is doing? Did it resolve?  Her pulmonary function study does not look like an asthma picture, which was suggested as cough is not related to reactive airway disease but more likely related to reflux or some other issue. Let me know

## 2013-08-11 ENCOUNTER — Other Ambulatory Visit: Payer: Self-pay | Admitting: Medical

## 2013-09-03 ENCOUNTER — Encounter: Payer: Self-pay | Admitting: Family Medicine

## 2013-09-03 ENCOUNTER — Ambulatory Visit (INDEPENDENT_AMBULATORY_CARE_PROVIDER_SITE_OTHER): Payer: BC Managed Care – PPO | Admitting: Family Medicine

## 2013-09-03 VITALS — BP 100/72 | Wt 206.0 lb

## 2013-09-03 DIAGNOSIS — R05 Cough: Secondary | ICD-10-CM

## 2013-09-03 DIAGNOSIS — R053 Chronic cough: Secondary | ICD-10-CM

## 2013-09-03 DIAGNOSIS — R059 Cough, unspecified: Secondary | ICD-10-CM

## 2013-09-03 NOTE — Progress Notes (Signed)
   Subjective:    Patient ID: Taylor Robbins, female    DOB: 19-Apr-1965, 48 y.o.   MRN: [id number]  HPI He is here for consult concerning continued difficulty with coughing. Review of record indicates she did have PFTs done which was negative. Apparently there was a question of reflux disease. She does note that the cough is worse at night when she lays down. She has no difficulty with singing but does tend to cough when she finishes. She has no acid reflux symptoms or chest pain associated with this.   Review of Systems     Objective:   Physical Exam Alert and in no distress otherwise not examined       Assessment & Plan:  Persistent dry cough  since there is a question of reflux especially with her symptoms, I will give her Dexilant samples. She is to call me at the end of the week.

## 2013-09-05 ENCOUNTER — Telehealth: Payer: Self-pay | Admitting: Medical

## 2013-09-05 NOTE — Telephone Encounter (Signed)
pls call and let her know that Dr. Redmond School and I talked yesterday regarding her symptoms.  I apologize if there was a miscommunication.  After we called back from the pulmonary function test results, I was under the impression that the cough was getting better, thus, I had not advised of any followup.   This apparently was not the case.  I just wanted her to know that I didn't intend for her to not get proper care, and I am sorry if I didn't give her additional information.   She is going to be using Dexilant to see if GERD is the cause of the cough, and I think she has followup scheduled such as in 2 weeks regarding cough.

## 2013-09-05 NOTE — Telephone Encounter (Signed)
I went over your message in detail and I did apologize a few more times as well. She said thank you and she appreciates the call. CLS

## 2014-03-30 ENCOUNTER — Other Ambulatory Visit: Payer: Self-pay | Admitting: Medical

## 2014-05-06 ENCOUNTER — Other Ambulatory Visit: Payer: Self-pay | Admitting: Medical

## 2014-06-05 ENCOUNTER — Other Ambulatory Visit: Payer: Self-pay | Admitting: Medical

## 2015-09-03 DIAGNOSIS — H6692 Otitis media, unspecified, left ear: Secondary | ICD-10-CM | POA: Diagnosis not present

## 2015-10-09 DIAGNOSIS — H524 Presbyopia: Secondary | ICD-10-CM | POA: Diagnosis not present

## 2016-02-13 DIAGNOSIS — J209 Acute bronchitis, unspecified: Secondary | ICD-10-CM | POA: Diagnosis not present

## 2016-06-08 DIAGNOSIS — E785 Hyperlipidemia, unspecified: Secondary | ICD-10-CM | POA: Diagnosis not present

## 2016-06-08 DIAGNOSIS — Z0000 Encounter for general adult medical examination without abnormal findings: Secondary | ICD-10-CM | POA: Diagnosis not present

## 2016-06-08 DIAGNOSIS — Z131 Encounter for screening for diabetes mellitus: Secondary | ICD-10-CM | POA: Diagnosis not present

## 2016-06-08 DIAGNOSIS — I10 Essential (primary) hypertension: Secondary | ICD-10-CM | POA: Diagnosis not present

## 2016-07-13 DIAGNOSIS — D1720 Benign lipomatous neoplasm of skin and subcutaneous tissue of unspecified limb: Secondary | ICD-10-CM | POA: Diagnosis not present

## 2016-11-12 ENCOUNTER — Other Ambulatory Visit: Payer: Self-pay | Admitting: General Surgery

## 2016-11-12 DIAGNOSIS — D1739 Benign lipomatous neoplasm of skin and subcutaneous tissue of other sites: Secondary | ICD-10-CM | POA: Diagnosis not present

## 2016-11-12 DIAGNOSIS — D1722 Benign lipomatous neoplasm of skin and subcutaneous tissue of left arm: Secondary | ICD-10-CM | POA: Diagnosis not present

## 2016-12-06 DIAGNOSIS — H524 Presbyopia: Secondary | ICD-10-CM | POA: Diagnosis not present

## 2016-12-15 ENCOUNTER — Encounter (HOSPITAL_COMMUNITY): Payer: Self-pay | Admitting: *Deleted

## 2016-12-15 ENCOUNTER — Emergency Department (HOSPITAL_COMMUNITY)
Admission: EM | Admit: 2016-12-15 | Discharge: 2016-12-16 | Disposition: A | Discharge status: Home / Self Care | Payer: BLUE CROSS/BLUE SHIELD | Attending: Emergency Medicine | Admitting: Emergency Medicine

## 2016-12-15 ENCOUNTER — Emergency Department (HOSPITAL_COMMUNITY): Payer: BLUE CROSS/BLUE SHIELD

## 2016-12-15 DIAGNOSIS — T189XXA Foreign body of alimentary tract, part unspecified, initial encounter: Secondary | ICD-10-CM | POA: Diagnosis not present

## 2016-12-15 DIAGNOSIS — R198 Other specified symptoms and signs involving the digestive system and abdomen: Secondary | ICD-10-CM

## 2016-12-15 DIAGNOSIS — Z79899 Other long term (current) drug therapy: Secondary | ICD-10-CM | POA: Diagnosis not present

## 2016-12-15 DIAGNOSIS — R0989 Other specified symptoms and signs involving the circulatory and respiratory systems: Secondary | ICD-10-CM | POA: Insufficient documentation

## 2016-12-15 DIAGNOSIS — T17208A Unspecified foreign body in pharynx causing other injury, initial encounter: Secondary | ICD-10-CM

## 2016-12-15 DIAGNOSIS — I10 Essential (primary) hypertension: Secondary | ICD-10-CM | POA: Insufficient documentation

## 2016-12-15 DIAGNOSIS — Z1211 Encounter for screening for malignant neoplasm of colon: Secondary | ICD-10-CM | POA: Diagnosis not present

## 2016-12-15 DIAGNOSIS — F458 Other somatoform disorders: Secondary | ICD-10-CM | POA: Diagnosis not present

## 2016-12-15 DIAGNOSIS — J029 Acute pharyngitis, unspecified: Secondary | ICD-10-CM | POA: Diagnosis present

## 2016-12-15 MED ORDER — GI COCKTAIL ~~LOC~~
30.0000 mL | Freq: Once | ORAL | Status: AC
  Administered 2016-12-15: 30 mL via ORAL
  Filled 2016-12-15: qty 30

## 2016-12-15 NOTE — ED Triage Notes (Signed)
To ED for eval of pain in throat when swallowing after eating chicken around 3:30 today and swallowing a piece of bone. Pt states she was able to breath but made herself vomiting and piece of sharp bone came out. No blood in emesis. No airway compromise. Speaks in full complete sentences. No hoarseness to her voice. No swelling to pts neck noted

## 2016-12-15 NOTE — ED Notes (Signed)
Pt to xray at this time.

## 2016-12-15 NOTE — ED Notes (Signed)
Patient transported to X-ray 

## 2016-12-15 NOTE — ED Notes (Signed)
Pt c/o pain in right side of throat after swallowing a chicken bone.  Pt st's she vomited afterwards and the chicken bone came up but st's she feels like there is a cut in her throat.   Pt denies spitting up any blood.  No resp. Problems present

## 2016-12-16 MED ORDER — LIDOCAINE VISCOUS 2 % MT SOLN: 15.0000 mL | OROMUCOSAL | 0 refills | Status: AC | PRN

## 2016-12-16 NOTE — Discharge Instructions (Signed)
15 mL of lidocaine can be taken once every 3 hours as needed for pain with swallowing. Please do not use this medication more than directed. You can also try taking over-the-counter Maalox to help with your symptoms.  Just like when you get a cut on your arm, it may take several days before your pain resolves.  If you develop any new or worsening symptoms, such as respiratory distress or difficulty breathing, or coughing up blood, please return to the emergency department for re-evaluation.

## 2016-12-16 NOTE — ED Provider Notes (Signed)
[facility] DEPT Provider Note   CSN: [id number] Arrival date & time: 12/15/16  0258     History   Chief Complaint Chief Complaint  Patient presents with  . Sore Throat    HPI Taylor Robbins is a 51 y.o. female who presents to the emergency provider and with a chief complaint of foreign body in throat with an associated sore throat. The patient reports that she was eating a drumstick for dinner tonight when she swallowed a small part of the bone. She states that the bone did not cause any difficulty breathing because of our was located. She states that she could feel it scratching against her throat, so she forced herself to vomit, which caused the bone to be expelled. She states that after she vomited, she continued to have a globus sensation in the throat, but was concerned another piece of bone may be "stuck in there" so she came to the emergency department for evaluation.  She denies hematemesis, hemoptysis, shortness of breath, or chest pain. No history of similar.   The history is provided by the patient. No language interpreter was used.    Past Medical History:  Diagnosis Date  . Anemia   . History of blood transfusion    remote hx/o  . Hypertension   . Hypertension in pregnancy   . Nearsightedness    wears glasses  . Uterine fibroid     Patient Active Problem List   Diagnosis Date Noted  . Abnormal ECG 12/08/2011  . Essential hypertension, benign 11/12/2011  . Hypertension 11/04/2011    Past Surgical History:  Procedure Laterality Date  . ABDOMINAL HYSTERECTOMY N/A 08/28/2012   Procedure: HYSTERECTOMY ABDOMINAL;  Surgeon: Luz Lex, MD;  Location: [facility];  Service: Gynecology;  Laterality: N/A;  . CESAREAN SECTION    . MYOMECTOMY    . TONSILLECTOMY      OB History    No data available       Home Medications    Prior to Admission medications   Medication Sig Start Date End Date Taking? Authorizing Provider  albuterol (PROVENTIL HFA;VENTOLIN  HFA) 108 (90 BASE) MCG/ACT inhaler Inhale 2 puffs into the lungs every 6 (six) hours as needed for wheezing or shortness of breath. 06/13/13   Tysinger, Camelia Eng, PA-C  cholecalciferol (VITAMIN D) 1000 UNITS tablet Take 1,000 Units by mouth daily.    [provider]  ECHINACEA PO Take 1 capsule by mouth daily. For immune health    [provider]  hydrochlorothiazide (MICROZIDE) 12.5 MG capsule TAKE 1 CAPSULE (12.5 MG TOTAL) BY MOUTH DAILY. 04/01/14   Tysinger, Camelia Eng, PA-C  IRON PO Take 1 tablet by mouth daily.    [provider]  lidocaine (XYLOCAINE) 2 % solution Use as directed 15 mLs in the mouth or throat every 3 (three) hours as needed for mouth pain. 12/16/16   Takeisha Cianci A, PA-C  MAGNESIUM PO Take 1 tablet by mouth daily.    [provider]    Family History Family History  Problem Relation Age of Onset  . Hypertension Father   . Stroke Father        died of CVA  . Heart disease Father        CHF  . Hypertension Mother   . Heart disease Sister 46       valvuloplasty    Social History Social History  Substance Use Topics  . Smoking status: Never Smoker  . Smokeless tobacco: Not  on file  . Alcohol use No     Allergies   Patient has no known allergies.   Review of Systems Review of Systems  HENT: Positive for sore throat and trouble swallowing.   Respiratory: Negative for apnea and shortness of breath.      Physical Exam Updated Vital Signs BP (!) 151/90 (BP Location: Right Arm)   Pulse 63   Temp 98.2 F (36.8 C) (Oral)   Resp 16   LMP 08/12/2012   SpO2 99%   Physical Exam  Constitutional: No distress.  HENT:  Head: Normocephalic.  Mouth/Throat: Uvula is midline, oropharynx is clear and moist and mucous membranes are normal. No oropharyngeal exudate, posterior oropharyngeal edema or posterior oropharyngeal erythema.  No foreign body observed in the posterior oropharynx.  Eyes: Conjunctivae are normal.  Neck: Trachea  normal, normal range of motion and full passive range of motion without pain. Neck supple. No tracheal tenderness, no spinous process tenderness and no muscular tenderness present. No tracheal deviation, no edema and no erythema present. No thyromegaly present.  Cardiovascular: Normal rate and regular rhythm.  Exam reveals no gallop and no friction rub.   No murmur heard. Pulmonary/Chest: Effort normal. No stridor. No respiratory distress.  Abdominal: Soft. She exhibits no distension.  Neurological: She is alert.  Skin: Skin is warm. No rash noted.  Psychiatric: Her behavior is normal.  Nursing note and vitals reviewed.    ED Treatments / Results  Labs (all labs ordered are listed, but only abnormal results are displayed) Labs Reviewed - No data to display  EKG  EKG Interpretation None       Radiology Dg Neck Soft Tissue  Result Date: 12/16/2016 CLINICAL DATA:  Choked on chicken bone earlier tonight then swallowed it. EXAM: NECK SOFT TISSUES - 1+ VIEW COMPARISON:  None. FINDINGS: There is no evidence of retropharyngeal soft tissue swelling or epiglottic enlargement. The cervical airway is unremarkable and no radio-opaque foreign body identified. Moderate spondylosis of the cervical spine. IMPRESSION: No acute findings.  No radiopaque foreign body. Electronically Signed   By: Marin Olp M.D.   On: 12/16/2016 00:02    Procedures Procedures (including critical care time)  Medications Ordered in ED Medications  gi cocktail (Maalox,Lidocaine,Donnatal) (30 mLs Oral Given 12/15/16 2334)     Initial Impression / Assessment and Plan / ED Course  I have reviewed the triage vital signs and the nursing notes.  Pertinent labs & imaging results that were available during my care of the patient were reviewed by me and considered in my medical decision making (see chart for details).     51 year old female presenting for evaluation of sore throat after she swallowed part of a chicken  bone earlier tonight. She states that she was able to expel a small piece of bone by inducing vomiting. Soft tissue neck x-ray unremarkable for radiopaque foreign body, soft tissue swelling of the esophagus, or abnormalities to the cervical airway. GI cocktail given in the emergency department with improvement of the patient's sore throat. Will discharge the patient to home with a course of viscous lidocaine. Strict return precautions given. No acute distress. The patient is safe for discharge at this time.  Final Clinical Impressions(s) / ED Diagnoses   Final diagnoses:  Globus sensation  Foreign body in throat, initial encounter    New Prescriptions New Prescriptions   LIDOCAINE (XYLOCAINE) 2 % SOLUTION    Use as directed 15 mLs in the mouth or throat every 3 (three) hours  as needed for mouth pain.     Joline Maxcy A, PA-C 12/16/16 [id number]    Sherwood Gambler, MD 12/17/16 [phone]

## 2017-02-01 DIAGNOSIS — Z1211 Encounter for screening for malignant neoplasm of colon: Secondary | ICD-10-CM | POA: Diagnosis not present

## 2017-07-18 DIAGNOSIS — J069 Acute upper respiratory infection, unspecified: Secondary | ICD-10-CM | POA: Diagnosis not present

## 2018-05-05 DIAGNOSIS — R8290 Unspecified abnormal findings in urine: Secondary | ICD-10-CM | POA: Diagnosis not present

## 2018-05-05 DIAGNOSIS — Z1239 Encounter for other screening for malignant neoplasm of breast: Secondary | ICD-10-CM | POA: Diagnosis not present

## 2018-05-05 DIAGNOSIS — I10 Essential (primary) hypertension: Secondary | ICD-10-CM | POA: Diagnosis not present

## 2018-05-05 DIAGNOSIS — R0981 Nasal congestion: Secondary | ICD-10-CM | POA: Diagnosis not present

## 2018-06-12 DIAGNOSIS — H66001 Acute suppurative otitis media without spontaneous rupture of ear drum, right ear: Secondary | ICD-10-CM | POA: Diagnosis not present

## 2018-07-12 DIAGNOSIS — R109 Unspecified abdominal pain: Secondary | ICD-10-CM | POA: Diagnosis not present

## 2018-07-31 DIAGNOSIS — H9203 Otalgia, bilateral: Secondary | ICD-10-CM | POA: Diagnosis not present

## 2018-08-23 ENCOUNTER — Other Ambulatory Visit: Payer: Self-pay | Admitting: *Deleted

## 2018-08-23 DIAGNOSIS — Z20822 Contact with and (suspected) exposure to covid-19: Secondary | ICD-10-CM

## 2018-08-23 NOTE — Progress Notes (Signed)
la 

## 2018-08-24 LAB — NOVEL CORONAVIRUS, NAA: SARS-CoV-2, NAA: NOT DETECTED

## 2018-09-05 DIAGNOSIS — H9203 Otalgia, bilateral: Secondary | ICD-10-CM | POA: Diagnosis not present

## 2018-10-05 DIAGNOSIS — H938X3 Other specified disorders of ear, bilateral: Secondary | ICD-10-CM | POA: Diagnosis not present

## 2018-10-05 DIAGNOSIS — H6983 Other specified disorders of Eustachian tube, bilateral: Secondary | ICD-10-CM | POA: Diagnosis not present

## 2018-10-07 ENCOUNTER — Encounter (HOSPITAL_COMMUNITY): Payer: Self-pay | Admitting: Emergency Medicine

## 2018-10-07 ENCOUNTER — Emergency Department (HOSPITAL_COMMUNITY)
Admission: EM | Admit: 2018-10-07 | Discharge: 2018-10-07 | Disposition: A | Discharge status: Home / Self Care | Payer: BC Managed Care – PPO | Attending: Emergency Medicine | Admitting: Emergency Medicine

## 2018-10-07 ENCOUNTER — Other Ambulatory Visit: Payer: Self-pay

## 2018-10-07 DIAGNOSIS — I10 Essential (primary) hypertension: Secondary | ICD-10-CM

## 2018-10-07 DIAGNOSIS — Z79899 Other long term (current) drug therapy: Secondary | ICD-10-CM | POA: Diagnosis not present

## 2018-10-07 DIAGNOSIS — H9203 Otalgia, bilateral: Secondary | ICD-10-CM | POA: Diagnosis not present

## 2018-10-07 DIAGNOSIS — R51 Headache: Secondary | ICD-10-CM | POA: Diagnosis present

## 2018-10-07 LAB — BASIC METABOLIC PANEL
Anion gap: 11 (ref 5–15)
BUN: 10 mg/dL (ref 6–20)
CO2: 25 mmol/L (ref 22–32)
Calcium: 9.7 mg/dL (ref 8.9–10.3)
Chloride: 101 mmol/L (ref 98–111)
Creatinine, Ser: 1.24 mg/dL — ABNORMAL HIGH (ref 0.44–1.00)
GFR calc Af Amer: 57 mL/min — ABNORMAL LOW (ref >60)
GFR calc non Af Amer: 50 mL/min — ABNORMAL LOW (ref >60)
Glucose, Bld: 102 mg/dL — ABNORMAL HIGH (ref 70–99)
Potassium: 4.2 mmol/L (ref 3.5–5.1)
Sodium: 137 mmol/L (ref 135–145)

## 2018-10-07 NOTE — ED Provider Notes (Signed)
[facility] EMERGENCY DEPARTMENT Provider Note   CSN: [id number] Arrival date & time: 10/07/18  1347     History   Chief Complaint Chief Complaint  Patient presents with  . Hypertension  . Headache    HPI Taylor Robbins is a 53 y.o. female.     Patient with history of HTN, currently taking HCTZ 12.5 daily, presents with complaint of elevated blood pressure.  Patient states that she was first alerted to this during an ENT appointment recently when she went to be seen for ear discomfort.  She had a normal exam there except for her blood pressure which was elevated.  She has continue to check her blood pressure at home and it has been as high as the 122Q systolic.  This concerned her so she came to the emergency department today.  She denies any severe headaches or stroke symptoms, vision change, chest pain, shortness of breath.  She states that her doctor asked her to double up on her blood pressure medicine for a few days.  States that her father had complications of hypertension.      Past Medical History:  Diagnosis Date  . Anemia   . History of blood transfusion    remote hx/o  . Hypertension   . Hypertension in pregnancy   . Nearsightedness    wears glasses  . Uterine fibroid     Patient Active Problem List   Diagnosis Date Noted  . Abnormal ECG 12/08/2011  . Essential hypertension, benign 11/12/2011  . Hypertension 11/04/2011    Past Surgical History:  Procedure Laterality Date  . ABDOMINAL HYSTERECTOMY N/A 08/28/2012   Procedure: HYSTERECTOMY ABDOMINAL;  Surgeon: Luz Lex, MD;  Location: [facility];  Service: Gynecology;  Laterality: N/A;  . CESAREAN SECTION    . MYOMECTOMY    . TONSILLECTOMY       OB History   No obstetric history on file.      Home Medications    Prior to Admission medications   Medication Sig Start Date End Date Taking? Authorizing Provider  albuterol (PROVENTIL HFA;VENTOLIN HFA) 108 (90 BASE) MCG/ACT inhaler  Inhale 2 puffs into the lungs every 6 (six) hours as needed for wheezing or shortness of breath. 06/13/13   Tysinger, Camelia Eng, PA-C  cholecalciferol (VITAMIN D) 1000 UNITS tablet Take 1,000 Units by mouth daily.    [provider]  ECHINACEA PO Take 1 capsule by mouth daily. For immune health    [provider]  hydrochlorothiazide (MICROZIDE) 12.5 MG capsule TAKE 1 CAPSULE (12.5 MG TOTAL) BY MOUTH DAILY. 04/01/14   Tysinger, Camelia Eng, PA-C  IRON PO Take 1 tablet by mouth daily.    [provider]  lidocaine (XYLOCAINE) 2 % solution Use as directed 15 mLs in the mouth or throat every 3 (three) hours as needed for mouth pain. 12/16/16   McDonald, Mia A, PA-C  MAGNESIUM PO Take 1 tablet by mouth daily.    [provider]    Family History Family History  Problem Relation Age of Onset  . Hypertension Father   . Stroke Father        died of CVA  . Heart disease Father        CHF  . Hypertension Mother   . Heart disease Sister 14       valvuloplasty    Social History Social History   Tobacco Use  . Smoking status: Never Smoker  . Smokeless tobacco: Never Used  Substance Use Topics  . Alcohol use: No  . Drug use: No     Allergies   Patient has no known allergies.   Review of Systems Review of Systems  Constitutional: Negative for fever.  HENT: Positive for ear pain. Negative for rhinorrhea and sore throat.   Eyes: Negative for redness.  Respiratory: Negative for cough.   Cardiovascular: Negative for chest pain.  Gastrointestinal: Negative for abdominal pain, diarrhea, nausea and vomiting.  Genitourinary: Negative for dysuria.  Musculoskeletal: Negative for myalgias.  Skin: Negative for rash.  Neurological: Negative for headaches.     Physical Exam Updated Vital Signs BP (!) 158/107 (BP Location: Right Arm)   Pulse 99   Temp 98.3 F (36.8 C) (Oral)   Resp 16   Ht 5\' 6"  (1.676 m)   Wt 82.6 kg   LMP 08/12/2012   SpO2 98%   BMI  29.38 kg/m   Physical Exam Vitals signs and nursing note reviewed.  Constitutional:      Appearance: She is well-developed. She is not diaphoretic.  HENT:     Head: Normocephalic and atraumatic.     Mouth/Throat:     Mouth: Mucous membranes are not dry.  Eyes:     Conjunctiva/sclera: Conjunctivae normal.  Neck:     Musculoskeletal: Normal range of motion and neck supple. No muscular tenderness.     Vascular: Normal carotid pulses. No carotid bruit or JVD.     Trachea: Trachea normal. No tracheal deviation.  Cardiovascular:     Rate and Rhythm: Normal rate and regular rhythm.     Pulses: No decreased pulses.     Heart sounds: Normal heart sounds, S1 normal and S2 normal. No murmur.  Pulmonary:     Effort: Pulmonary effort is normal. No respiratory distress.     Breath sounds: No wheezing.  Chest:     Chest wall: No tenderness.  Abdominal:     General: Bowel sounds are normal.     Palpations: Abdomen is soft.     Tenderness: There is no abdominal tenderness. There is no guarding or rebound.  Musculoskeletal: Normal range of motion.  Skin:    General: Skin is warm and dry.     Coloration: Skin is not pale.  Neurological:     Mental Status: She is alert.      ED Treatments / Results  Labs (all labs ordered are listed, but only abnormal results are displayed) Labs Reviewed  BASIC METABOLIC PANEL - Abnormal; Notable for the following components:      Result Value   Glucose, Bld 102 (*)    Creatinine, Ser 1.24 (*)    GFR calc non Af Amer 50 (*)    GFR calc Af Amer 57 (*)    All other components within normal limits    EKG None  Radiology No results found.  Procedures Procedures (including critical care time)  Medications Ordered in ED Medications - No data to display   Initial Impression / Assessment and Plan / ED Course  I have reviewed the triage vital signs and the nursing notes.  Pertinent labs & imaging results that were available during my care of  the patient were reviewed by me and considered in my medical decision making (see chart for details).        Patient seen and examined.  Will check BMP to ensure creatinine is normal.  Anticipate increasing patient's HCTZ from 12.5 mg to 25 mg and having her follow-up with her doctor  and log her blood pressures.  Patient in agreement.  Vital signs reviewed and are as follows: BP (!) 158/107 (BP Location: Right Arm)   Pulse 99   Temp 98.3 F (36.8 C) (Oral)   Resp 16   Ht 5\' 6"  (1.676 m)   Wt 82.6 kg   LMP 08/12/2012   SpO2 98%   BMI 29.38 kg/m   3:56 PM patient updated on results.  She will increase HCTZ and follow-up with her doctor in 1 week.  Final Clinical Impressions(s) / ED Diagnoses   Final diagnoses:  Essential hypertension   Patient with elevated blood pressure readings.  Plan as above.  No signs of endorgan damage.  Very slightly elevated creatinine today which can be monitored by her physician.  ED Discharge Orders    None       Carlisle Cater, Hershal Coria 10/07/18 [place], MD 10/09/18 2202

## 2018-10-07 NOTE — ED Notes (Signed)
ED Provider at bedside. 

## 2018-10-07 NOTE — ED Triage Notes (Signed)
Pt. Stated, my BP has been running high , this has been going on for 3 days.

## 2018-10-07 NOTE — Discharge Instructions (Signed)
Please read and follow all provided instructions.  Your diagnoses today include:  1. Essential hypertension     Your blood pressure was high today (BP): BP (!) 158/107 (BP Location: Right Arm)    Pulse 99    Temp 98.3 F (36.8 C) (Oral)    Resp 16    Ht 5\' 6"  (1.676 m)    Wt 82.6 kg    LMP 08/12/2012    SpO2 98%    BMI 29.38 kg/m   Tests performed today include:  Vital signs. See below for your results today.   Electrolytes - shows very slightly elevated kidney function, this just needs to be monitored by your doctor.   Medications prescribed:   None  Home care instructions:  Follow any educational materials contained in this packet.  Increase your HCTZ dose to 25mg  daily.   Follow-up instructions: Please follow-up with your primary care provider in the next 7 days for a recheck of your symptoms and blood pressure.    Return instructions:   Please return to the Emergency Department if you experience worsening symptoms.   Return with severe chest pain, abdominal pain, or shortness of breath.   Return with severe headache, focal weakness, numbness, difficulty with speech or vision.  Return with loss of vision or sudden vision change.  Please return if you have any other emergent concerns.  Additional Information:  Your vital signs today were: BP (!) 158/107 (BP Location: Right Arm)    Pulse 99    Temp 98.3 F (36.8 C) (Oral)    Resp 16    Ht 5\' 6"  (1.676 m)    Wt 82.6 kg    LMP 08/12/2012    SpO2 98%    BMI 29.38 kg/m  If your blood pressure (BP) was elevated above 135/85 this visit, please have this repeated by your doctor within one month. --------------

## 2018-10-07 NOTE — ED Notes (Signed)
Patient verbalizes understanding of discharge instructions. Opportunity for questioning and answers were provided. Armband removed by staff, pt discharged from ED.  

## 2018-11-01 DIAGNOSIS — I10 Essential (primary) hypertension: Secondary | ICD-10-CM | POA: Diagnosis not present

## 2018-11-01 DIAGNOSIS — H938X1 Other specified disorders of right ear: Secondary | ICD-10-CM | POA: Diagnosis not present

## 2018-11-01 DIAGNOSIS — M25511 Pain in right shoulder: Secondary | ICD-10-CM | POA: Diagnosis not present

## 2018-12-19 DIAGNOSIS — M25511 Pain in right shoulder: Secondary | ICD-10-CM | POA: Diagnosis not present

## 2018-12-19 DIAGNOSIS — M7501 Adhesive capsulitis of right shoulder: Secondary | ICD-10-CM | POA: Diagnosis not present

## 2018-12-25 DIAGNOSIS — M7501 Adhesive capsulitis of right shoulder: Secondary | ICD-10-CM | POA: Diagnosis not present

## 2018-12-29 DIAGNOSIS — H6983 Other specified disorders of Eustachian tube, bilateral: Secondary | ICD-10-CM | POA: Diagnosis not present

## 2018-12-29 DIAGNOSIS — H938X3 Other specified disorders of ear, bilateral: Secondary | ICD-10-CM | POA: Diagnosis not present

## 2019-01-03 DIAGNOSIS — M7501 Adhesive capsulitis of right shoulder: Secondary | ICD-10-CM | POA: Diagnosis not present

## 2019-01-05 DIAGNOSIS — M7501 Adhesive capsulitis of right shoulder: Secondary | ICD-10-CM | POA: Diagnosis not present

## 2019-01-08 DIAGNOSIS — M7501 Adhesive capsulitis of right shoulder: Secondary | ICD-10-CM | POA: Diagnosis not present

## 2019-01-10 DIAGNOSIS — M7501 Adhesive capsulitis of right shoulder: Secondary | ICD-10-CM | POA: Diagnosis not present

## 2019-01-15 DIAGNOSIS — M7501 Adhesive capsulitis of right shoulder: Secondary | ICD-10-CM | POA: Diagnosis not present

## 2019-01-18 DIAGNOSIS — M7501 Adhesive capsulitis of right shoulder: Secondary | ICD-10-CM | POA: Diagnosis not present

## 2019-01-29 DIAGNOSIS — M7501 Adhesive capsulitis of right shoulder: Secondary | ICD-10-CM | POA: Diagnosis not present

## 2019-02-05 DIAGNOSIS — M7501 Adhesive capsulitis of right shoulder: Secondary | ICD-10-CM | POA: Diagnosis not present

## 2019-02-12 DIAGNOSIS — S39012D Strain of muscle, fascia and tendon of lower back, subsequent encounter: Secondary | ICD-10-CM | POA: Diagnosis not present

## 2019-02-12 DIAGNOSIS — R350 Frequency of micturition: Secondary | ICD-10-CM | POA: Diagnosis not present

## 2019-02-14 DIAGNOSIS — H938X3 Other specified disorders of ear, bilateral: Secondary | ICD-10-CM | POA: Diagnosis not present

## 2019-02-14 DIAGNOSIS — H6983 Other specified disorders of Eustachian tube, bilateral: Secondary | ICD-10-CM | POA: Diagnosis not present

## 2019-02-16 DIAGNOSIS — M7501 Adhesive capsulitis of right shoulder: Secondary | ICD-10-CM | POA: Diagnosis not present

## 2019-02-20 DIAGNOSIS — H6983 Other specified disorders of Eustachian tube, bilateral: Secondary | ICD-10-CM | POA: Diagnosis not present

## 2019-02-23 DIAGNOSIS — M7501 Adhesive capsulitis of right shoulder: Secondary | ICD-10-CM | POA: Diagnosis not present

## 2019-02-26 DIAGNOSIS — M7501 Adhesive capsulitis of right shoulder: Secondary | ICD-10-CM | POA: Diagnosis not present

## 2019-03-05 DIAGNOSIS — M7501 Adhesive capsulitis of right shoulder: Secondary | ICD-10-CM | POA: Diagnosis not present

## 2019-03-12 DIAGNOSIS — M7501 Adhesive capsulitis of right shoulder: Secondary | ICD-10-CM | POA: Diagnosis not present

## 2019-03-29 ENCOUNTER — Other Ambulatory Visit: Payer: Self-pay | Admitting: Cardiology

## 2019-03-29 DIAGNOSIS — Z20822 Contact with and (suspected) exposure to covid-19: Secondary | ICD-10-CM | POA: Diagnosis not present

## 2019-03-30 LAB — NOVEL CORONAVIRUS, NAA: SARS-CoV-2, NAA: NOT DETECTED

## 2019-04-10 DIAGNOSIS — R600 Localized edema: Secondary | ICD-10-CM | POA: Diagnosis not present

## 2019-04-10 DIAGNOSIS — M659 Synovitis and tenosynovitis, unspecified: Secondary | ICD-10-CM | POA: Diagnosis not present

## 2019-04-10 DIAGNOSIS — M25511 Pain in right shoulder: Secondary | ICD-10-CM | POA: Diagnosis not present

## 2019-04-10 DIAGNOSIS — G8918 Other acute postprocedural pain: Secondary | ICD-10-CM | POA: Diagnosis not present

## 2019-04-10 DIAGNOSIS — M7501 Adhesive capsulitis of right shoulder: Secondary | ICD-10-CM | POA: Diagnosis not present

## 2019-04-10 DIAGNOSIS — M7541 Impingement syndrome of right shoulder: Secondary | ICD-10-CM | POA: Diagnosis not present

## 2019-04-10 DIAGNOSIS — M65811 Other synovitis and tenosynovitis, right shoulder: Secondary | ICD-10-CM | POA: Diagnosis not present

## 2019-04-10 DIAGNOSIS — M19011 Primary osteoarthritis, right shoulder: Secondary | ICD-10-CM | POA: Diagnosis not present

## 2019-04-10 DIAGNOSIS — Z4889 Encounter for other specified surgical aftercare: Secondary | ICD-10-CM | POA: Diagnosis not present

## 2019-04-10 DIAGNOSIS — M24111 Other articular cartilage disorders, right shoulder: Secondary | ICD-10-CM | POA: Diagnosis not present

## 2019-04-10 DIAGNOSIS — S43431A Superior glenoid labrum lesion of right shoulder, initial encounter: Secondary | ICD-10-CM | POA: Diagnosis not present

## 2019-04-11 DIAGNOSIS — M25511 Pain in right shoulder: Secondary | ICD-10-CM | POA: Diagnosis not present

## 2019-04-11 DIAGNOSIS — M7501 Adhesive capsulitis of right shoulder: Secondary | ICD-10-CM | POA: Diagnosis not present

## 2019-04-11 DIAGNOSIS — Z4889 Encounter for other specified surgical aftercare: Secondary | ICD-10-CM | POA: Diagnosis not present

## 2019-04-11 DIAGNOSIS — R600 Localized edema: Secondary | ICD-10-CM | POA: Diagnosis not present

## 2019-04-11 DIAGNOSIS — M25611 Stiffness of right shoulder, not elsewhere classified: Secondary | ICD-10-CM | POA: Diagnosis not present

## 2019-04-12 DIAGNOSIS — M7501 Adhesive capsulitis of right shoulder: Secondary | ICD-10-CM | POA: Diagnosis not present

## 2019-04-12 DIAGNOSIS — R600 Localized edema: Secondary | ICD-10-CM | POA: Diagnosis not present

## 2019-04-12 DIAGNOSIS — M25511 Pain in right shoulder: Secondary | ICD-10-CM | POA: Diagnosis not present

## 2019-04-12 DIAGNOSIS — M25611 Stiffness of right shoulder, not elsewhere classified: Secondary | ICD-10-CM | POA: Diagnosis not present

## 2019-04-12 DIAGNOSIS — Z4889 Encounter for other specified surgical aftercare: Secondary | ICD-10-CM | POA: Diagnosis not present

## 2019-04-13 DIAGNOSIS — Z4889 Encounter for other specified surgical aftercare: Secondary | ICD-10-CM | POA: Diagnosis not present

## 2019-04-13 DIAGNOSIS — R600 Localized edema: Secondary | ICD-10-CM | POA: Diagnosis not present

## 2019-04-13 DIAGNOSIS — M7501 Adhesive capsulitis of right shoulder: Secondary | ICD-10-CM | POA: Diagnosis not present

## 2019-04-14 DIAGNOSIS — R600 Localized edema: Secondary | ICD-10-CM | POA: Diagnosis not present

## 2019-04-14 DIAGNOSIS — M7501 Adhesive capsulitis of right shoulder: Secondary | ICD-10-CM | POA: Diagnosis not present

## 2019-04-14 DIAGNOSIS — Z4889 Encounter for other specified surgical aftercare: Secondary | ICD-10-CM | POA: Diagnosis not present

## 2019-04-15 DIAGNOSIS — R600 Localized edema: Secondary | ICD-10-CM | POA: Diagnosis not present

## 2019-04-15 DIAGNOSIS — Z4889 Encounter for other specified surgical aftercare: Secondary | ICD-10-CM | POA: Diagnosis not present

## 2019-04-15 DIAGNOSIS — M7501 Adhesive capsulitis of right shoulder: Secondary | ICD-10-CM | POA: Diagnosis not present

## 2019-04-16 DIAGNOSIS — M25611 Stiffness of right shoulder, not elsewhere classified: Secondary | ICD-10-CM | POA: Diagnosis not present

## 2019-04-16 DIAGNOSIS — M7501 Adhesive capsulitis of right shoulder: Secondary | ICD-10-CM | POA: Diagnosis not present

## 2019-04-16 DIAGNOSIS — M25511 Pain in right shoulder: Secondary | ICD-10-CM | POA: Diagnosis not present

## 2019-04-16 DIAGNOSIS — R600 Localized edema: Secondary | ICD-10-CM | POA: Diagnosis not present

## 2019-04-16 DIAGNOSIS — Z4889 Encounter for other specified surgical aftercare: Secondary | ICD-10-CM | POA: Diagnosis not present

## 2019-04-17 DIAGNOSIS — M7501 Adhesive capsulitis of right shoulder: Secondary | ICD-10-CM | POA: Diagnosis not present

## 2019-04-17 DIAGNOSIS — R600 Localized edema: Secondary | ICD-10-CM | POA: Diagnosis not present

## 2019-04-17 DIAGNOSIS — Z4889 Encounter for other specified surgical aftercare: Secondary | ICD-10-CM | POA: Diagnosis not present

## 2019-04-18 DIAGNOSIS — M7501 Adhesive capsulitis of right shoulder: Secondary | ICD-10-CM | POA: Diagnosis not present

## 2019-04-18 DIAGNOSIS — M25511 Pain in right shoulder: Secondary | ICD-10-CM | POA: Diagnosis not present

## 2019-04-18 DIAGNOSIS — M25611 Stiffness of right shoulder, not elsewhere classified: Secondary | ICD-10-CM | POA: Diagnosis not present

## 2019-04-18 DIAGNOSIS — R600 Localized edema: Secondary | ICD-10-CM | POA: Diagnosis not present

## 2019-04-18 DIAGNOSIS — Z4889 Encounter for other specified surgical aftercare: Secondary | ICD-10-CM | POA: Diagnosis not present

## 2019-04-19 DIAGNOSIS — M7501 Adhesive capsulitis of right shoulder: Secondary | ICD-10-CM | POA: Diagnosis not present

## 2019-04-19 DIAGNOSIS — Z4889 Encounter for other specified surgical aftercare: Secondary | ICD-10-CM | POA: Diagnosis not present

## 2019-04-19 DIAGNOSIS — R600 Localized edema: Secondary | ICD-10-CM | POA: Diagnosis not present

## 2019-04-20 DIAGNOSIS — R600 Localized edema: Secondary | ICD-10-CM | POA: Diagnosis not present

## 2019-04-20 DIAGNOSIS — M7501 Adhesive capsulitis of right shoulder: Secondary | ICD-10-CM | POA: Diagnosis not present

## 2019-04-20 DIAGNOSIS — M25511 Pain in right shoulder: Secondary | ICD-10-CM | POA: Diagnosis not present

## 2019-04-20 DIAGNOSIS — Z4889 Encounter for other specified surgical aftercare: Secondary | ICD-10-CM | POA: Diagnosis not present

## 2019-04-20 DIAGNOSIS — M25611 Stiffness of right shoulder, not elsewhere classified: Secondary | ICD-10-CM | POA: Diagnosis not present

## 2019-04-21 DIAGNOSIS — Z4889 Encounter for other specified surgical aftercare: Secondary | ICD-10-CM | POA: Diagnosis not present

## 2019-04-21 DIAGNOSIS — M7501 Adhesive capsulitis of right shoulder: Secondary | ICD-10-CM | POA: Diagnosis not present

## 2019-04-21 DIAGNOSIS — R600 Localized edema: Secondary | ICD-10-CM | POA: Diagnosis not present

## 2019-04-22 DIAGNOSIS — R600 Localized edema: Secondary | ICD-10-CM | POA: Diagnosis not present

## 2019-04-22 DIAGNOSIS — Z4889 Encounter for other specified surgical aftercare: Secondary | ICD-10-CM | POA: Diagnosis not present

## 2019-04-22 DIAGNOSIS — M7501 Adhesive capsulitis of right shoulder: Secondary | ICD-10-CM | POA: Diagnosis not present

## 2019-04-23 DIAGNOSIS — M25611 Stiffness of right shoulder, not elsewhere classified: Secondary | ICD-10-CM | POA: Diagnosis not present

## 2019-04-23 DIAGNOSIS — R600 Localized edema: Secondary | ICD-10-CM | POA: Diagnosis not present

## 2019-04-23 DIAGNOSIS — M25511 Pain in right shoulder: Secondary | ICD-10-CM | POA: Diagnosis not present

## 2019-04-23 DIAGNOSIS — M7501 Adhesive capsulitis of right shoulder: Secondary | ICD-10-CM | POA: Diagnosis not present

## 2019-04-23 DIAGNOSIS — Z4889 Encounter for other specified surgical aftercare: Secondary | ICD-10-CM | POA: Diagnosis not present

## 2019-04-24 DIAGNOSIS — Z4889 Encounter for other specified surgical aftercare: Secondary | ICD-10-CM | POA: Diagnosis not present

## 2019-04-24 DIAGNOSIS — M7501 Adhesive capsulitis of right shoulder: Secondary | ICD-10-CM | POA: Diagnosis not present

## 2019-04-24 DIAGNOSIS — R600 Localized edema: Secondary | ICD-10-CM | POA: Diagnosis not present

## 2019-04-25 DIAGNOSIS — R600 Localized edema: Secondary | ICD-10-CM | POA: Diagnosis not present

## 2019-04-25 DIAGNOSIS — M25611 Stiffness of right shoulder, not elsewhere classified: Secondary | ICD-10-CM | POA: Diagnosis not present

## 2019-04-25 DIAGNOSIS — M25511 Pain in right shoulder: Secondary | ICD-10-CM | POA: Diagnosis not present

## 2019-04-25 DIAGNOSIS — M7501 Adhesive capsulitis of right shoulder: Secondary | ICD-10-CM | POA: Diagnosis not present

## 2019-04-25 DIAGNOSIS — Z4889 Encounter for other specified surgical aftercare: Secondary | ICD-10-CM | POA: Diagnosis not present

## 2019-04-26 DIAGNOSIS — R600 Localized edema: Secondary | ICD-10-CM | POA: Diagnosis not present

## 2019-04-26 DIAGNOSIS — M7501 Adhesive capsulitis of right shoulder: Secondary | ICD-10-CM | POA: Diagnosis not present

## 2019-04-26 DIAGNOSIS — Z4889 Encounter for other specified surgical aftercare: Secondary | ICD-10-CM | POA: Diagnosis not present

## 2019-04-27 DIAGNOSIS — M7501 Adhesive capsulitis of right shoulder: Secondary | ICD-10-CM | POA: Diagnosis not present

## 2019-04-27 DIAGNOSIS — M25511 Pain in right shoulder: Secondary | ICD-10-CM | POA: Diagnosis not present

## 2019-04-27 DIAGNOSIS — M25611 Stiffness of right shoulder, not elsewhere classified: Secondary | ICD-10-CM | POA: Diagnosis not present

## 2019-04-27 DIAGNOSIS — R600 Localized edema: Secondary | ICD-10-CM | POA: Diagnosis not present

## 2019-04-27 DIAGNOSIS — Z4889 Encounter for other specified surgical aftercare: Secondary | ICD-10-CM | POA: Diagnosis not present

## 2019-04-28 DIAGNOSIS — Z4889 Encounter for other specified surgical aftercare: Secondary | ICD-10-CM | POA: Diagnosis not present

## 2019-04-28 DIAGNOSIS — M7501 Adhesive capsulitis of right shoulder: Secondary | ICD-10-CM | POA: Diagnosis not present

## 2019-04-28 DIAGNOSIS — R600 Localized edema: Secondary | ICD-10-CM | POA: Diagnosis not present

## 2019-04-29 DIAGNOSIS — M7501 Adhesive capsulitis of right shoulder: Secondary | ICD-10-CM | POA: Diagnosis not present

## 2019-04-29 DIAGNOSIS — Z4889 Encounter for other specified surgical aftercare: Secondary | ICD-10-CM | POA: Diagnosis not present

## 2019-04-29 DIAGNOSIS — R600 Localized edema: Secondary | ICD-10-CM | POA: Diagnosis not present

## 2019-04-30 DIAGNOSIS — Z4889 Encounter for other specified surgical aftercare: Secondary | ICD-10-CM | POA: Diagnosis not present

## 2019-04-30 DIAGNOSIS — R600 Localized edema: Secondary | ICD-10-CM | POA: Diagnosis not present

## 2019-04-30 DIAGNOSIS — M7501 Adhesive capsulitis of right shoulder: Secondary | ICD-10-CM | POA: Diagnosis not present

## 2019-05-01 DIAGNOSIS — Z4889 Encounter for other specified surgical aftercare: Secondary | ICD-10-CM | POA: Diagnosis not present

## 2019-05-01 DIAGNOSIS — R600 Localized edema: Secondary | ICD-10-CM | POA: Diagnosis not present

## 2019-05-01 DIAGNOSIS — M7501 Adhesive capsulitis of right shoulder: Secondary | ICD-10-CM | POA: Diagnosis not present

## 2019-05-02 DIAGNOSIS — R600 Localized edema: Secondary | ICD-10-CM | POA: Diagnosis not present

## 2019-05-02 DIAGNOSIS — M25511 Pain in right shoulder: Secondary | ICD-10-CM | POA: Diagnosis not present

## 2019-05-02 DIAGNOSIS — M25611 Stiffness of right shoulder, not elsewhere classified: Secondary | ICD-10-CM | POA: Diagnosis not present

## 2019-05-02 DIAGNOSIS — M7501 Adhesive capsulitis of right shoulder: Secondary | ICD-10-CM | POA: Diagnosis not present

## 2019-05-02 DIAGNOSIS — Z4889 Encounter for other specified surgical aftercare: Secondary | ICD-10-CM | POA: Diagnosis not present

## 2019-05-07 DIAGNOSIS — M25511 Pain in right shoulder: Secondary | ICD-10-CM | POA: Diagnosis not present

## 2019-05-07 DIAGNOSIS — M7501 Adhesive capsulitis of right shoulder: Secondary | ICD-10-CM | POA: Diagnosis not present

## 2019-05-07 DIAGNOSIS — M25611 Stiffness of right shoulder, not elsewhere classified: Secondary | ICD-10-CM | POA: Diagnosis not present

## 2019-05-09 DIAGNOSIS — M7501 Adhesive capsulitis of right shoulder: Secondary | ICD-10-CM | POA: Diagnosis not present

## 2019-05-09 DIAGNOSIS — M25611 Stiffness of right shoulder, not elsewhere classified: Secondary | ICD-10-CM | POA: Diagnosis not present

## 2019-05-09 DIAGNOSIS — M25511 Pain in right shoulder: Secondary | ICD-10-CM | POA: Diagnosis not present

## 2019-05-21 DIAGNOSIS — M25511 Pain in right shoulder: Secondary | ICD-10-CM | POA: Diagnosis not present

## 2019-05-23 DIAGNOSIS — H6983 Other specified disorders of Eustachian tube, bilateral: Secondary | ICD-10-CM | POA: Diagnosis not present

## 2019-05-29 DIAGNOSIS — M25511 Pain in right shoulder: Secondary | ICD-10-CM | POA: Diagnosis not present

## 2019-05-29 DIAGNOSIS — M7501 Adhesive capsulitis of right shoulder: Secondary | ICD-10-CM | POA: Diagnosis not present

## 2019-05-29 DIAGNOSIS — M25611 Stiffness of right shoulder, not elsewhere classified: Secondary | ICD-10-CM | POA: Diagnosis not present

## 2019-06-08 DIAGNOSIS — M25511 Pain in right shoulder: Secondary | ICD-10-CM | POA: Diagnosis not present

## 2019-06-08 DIAGNOSIS — M25611 Stiffness of right shoulder, not elsewhere classified: Secondary | ICD-10-CM | POA: Diagnosis not present

## 2019-06-08 DIAGNOSIS — M7501 Adhesive capsulitis of right shoulder: Secondary | ICD-10-CM | POA: Diagnosis not present

## 2019-07-17 DIAGNOSIS — R109 Unspecified abdominal pain: Secondary | ICD-10-CM | POA: Diagnosis not present

## 2019-07-17 DIAGNOSIS — Z789 Other specified health status: Secondary | ICD-10-CM | POA: Diagnosis not present

## 2019-08-03 DIAGNOSIS — Z4789 Encounter for other orthopedic aftercare: Secondary | ICD-10-CM | POA: Diagnosis not present

## 2019-08-08 DIAGNOSIS — Z0000 Encounter for general adult medical examination without abnormal findings: Secondary | ICD-10-CM | POA: Diagnosis not present

## 2019-08-08 DIAGNOSIS — Z13220 Encounter for screening for lipoid disorders: Secondary | ICD-10-CM | POA: Diagnosis not present

## 2019-08-08 DIAGNOSIS — R109 Unspecified abdominal pain: Secondary | ICD-10-CM | POA: Diagnosis not present

## 2019-08-14 ENCOUNTER — Other Ambulatory Visit: Payer: Self-pay | Admitting: Family Medicine

## 2019-08-14 DIAGNOSIS — R102 Pelvic and perineal pain: Secondary | ICD-10-CM

## 2019-08-14 DIAGNOSIS — R109 Unspecified abdominal pain: Secondary | ICD-10-CM

## 2019-08-20 DIAGNOSIS — M25611 Stiffness of right shoulder, not elsewhere classified: Secondary | ICD-10-CM | POA: Diagnosis not present

## 2019-08-20 DIAGNOSIS — M25511 Pain in right shoulder: Secondary | ICD-10-CM | POA: Diagnosis not present

## 2019-08-27 DIAGNOSIS — M25511 Pain in right shoulder: Secondary | ICD-10-CM | POA: Diagnosis not present

## 2019-08-27 DIAGNOSIS — M25611 Stiffness of right shoulder, not elsewhere classified: Secondary | ICD-10-CM | POA: Diagnosis not present

## 2019-09-03 ENCOUNTER — Ambulatory Visit
Admission: RE | Admit: 2019-09-03 | Discharge: 2019-09-03 | Disposition: A | Discharge status: Home / Self Care | Payer: BC Managed Care – PPO | Source: Ambulatory Visit | Attending: Family Medicine | Admitting: Family Medicine

## 2019-09-03 DIAGNOSIS — R102 Pelvic and perineal pain: Secondary | ICD-10-CM | POA: Diagnosis not present

## 2019-09-03 DIAGNOSIS — K8020 Calculus of gallbladder without cholecystitis without obstruction: Secondary | ICD-10-CM | POA: Diagnosis not present

## 2019-09-03 DIAGNOSIS — R109 Unspecified abdominal pain: Secondary | ICD-10-CM

## 2019-09-04 DIAGNOSIS — M25511 Pain in right shoulder: Secondary | ICD-10-CM | POA: Diagnosis not present

## 2019-09-04 DIAGNOSIS — M25611 Stiffness of right shoulder, not elsewhere classified: Secondary | ICD-10-CM | POA: Diagnosis not present

## 2019-09-11 ENCOUNTER — Other Ambulatory Visit: Payer: Self-pay | Admitting: Family Medicine

## 2019-09-11 DIAGNOSIS — Z1231 Encounter for screening mammogram for malignant neoplasm of breast: Secondary | ICD-10-CM

## 2019-09-12 DIAGNOSIS — M25511 Pain in right shoulder: Secondary | ICD-10-CM | POA: Diagnosis not present

## 2019-09-12 DIAGNOSIS — M25611 Stiffness of right shoulder, not elsewhere classified: Secondary | ICD-10-CM | POA: Diagnosis not present

## 2019-09-13 ENCOUNTER — Ambulatory Visit
Admission: RE | Admit: 2019-09-13 | Discharge: 2019-09-13 | Disposition: A | Discharge status: Home / Self Care | Payer: BC Managed Care – PPO | Source: Ambulatory Visit

## 2019-09-13 ENCOUNTER — Other Ambulatory Visit: Payer: Self-pay

## 2019-09-13 DIAGNOSIS — Z1231 Encounter for screening mammogram for malignant neoplasm of breast: Secondary | ICD-10-CM | POA: Diagnosis not present

## 2019-10-31 DIAGNOSIS — M549 Dorsalgia, unspecified: Secondary | ICD-10-CM | POA: Diagnosis not present

## 2019-10-31 DIAGNOSIS — M542 Cervicalgia: Secondary | ICD-10-CM | POA: Diagnosis not present

## 2019-10-31 DIAGNOSIS — K8020 Calculus of gallbladder without cholecystitis without obstruction: Secondary | ICD-10-CM | POA: Diagnosis not present

## 2019-10-31 DIAGNOSIS — I10 Essential (primary) hypertension: Secondary | ICD-10-CM | POA: Diagnosis not present

## 2019-12-19 ENCOUNTER — Other Ambulatory Visit: Payer: BC Managed Care – PPO

## 2019-12-19 ENCOUNTER — Other Ambulatory Visit: Payer: Self-pay

## 2019-12-19 DIAGNOSIS — Z20822 Contact with and (suspected) exposure to covid-19: Secondary | ICD-10-CM | POA: Diagnosis not present

## 2019-12-20 LAB — SARS-COV-2, NAA 2 DAY TAT

## 2019-12-20 LAB — NOVEL CORONAVIRUS, NAA: SARS-CoV-2, NAA: NOT DETECTED

## 2019-12-21 DIAGNOSIS — Z20822 Contact with and (suspected) exposure to covid-19: Secondary | ICD-10-CM | POA: Diagnosis not present

## 2020-01-07 ENCOUNTER — Ambulatory Visit: Payer: BC Managed Care – PPO | Attending: Internal Medicine

## 2020-01-07 DIAGNOSIS — Z23 Encounter for immunization: Secondary | ICD-10-CM

## 2020-01-07 NOTE — Progress Notes (Signed)
   Covid-19 Vaccination [facility]  Name:  Taylor Robbins    MRN: [id number] DOB: 10-Feb-1966  01/07/2020  Taylor Robbins was observed post Covid-19 immunization for 15 minutes without incident. She was provided with Vaccine Information Sheet and instruction to access the V-Safe system.   Taylor Robbins was instructed to call 911 with any severe reactions post vaccine: Marland Kitchen Difficulty breathing  . Swelling of face and throat  . A fast heartbeat  . A bad rash all over body  . Dizziness and weakness

## 2020-01-21 DIAGNOSIS — Z20822 Contact with and (suspected) exposure to covid-19: Secondary | ICD-10-CM | POA: Diagnosis not present

## 2020-04-21 ENCOUNTER — Other Ambulatory Visit: Payer: BC Managed Care – PPO

## 2021-01-08 ENCOUNTER — Other Ambulatory Visit: Payer: Self-pay | Admitting: Family Medicine

## 2021-01-08 DIAGNOSIS — Z1231 Encounter for screening mammogram for malignant neoplasm of breast: Secondary | ICD-10-CM

## 2021-02-10 ENCOUNTER — Ambulatory Visit
Admission: RE | Admit: 2021-02-10 | Discharge: 2021-02-10 | Disposition: A | Discharge status: Home / Self Care | Payer: BC Managed Care – PPO | Source: Ambulatory Visit

## 2021-02-10 DIAGNOSIS — Z1231 Encounter for screening mammogram for malignant neoplasm of breast: Secondary | ICD-10-CM

## 2021-02-15 IMAGING — US US ABDOMEN COMPLETE
1 series · 14 of 25 positions shown · non-contrast
Comparison: None.

CLINICAL DATA: Left flank pain for 3 months.

EXAM:
ABDOMEN ULTRASOUND COMPLETE

[Series 1: us abdomen complete · 0.17mm/px · 14 of 79 slices shown]
[im 1/79]
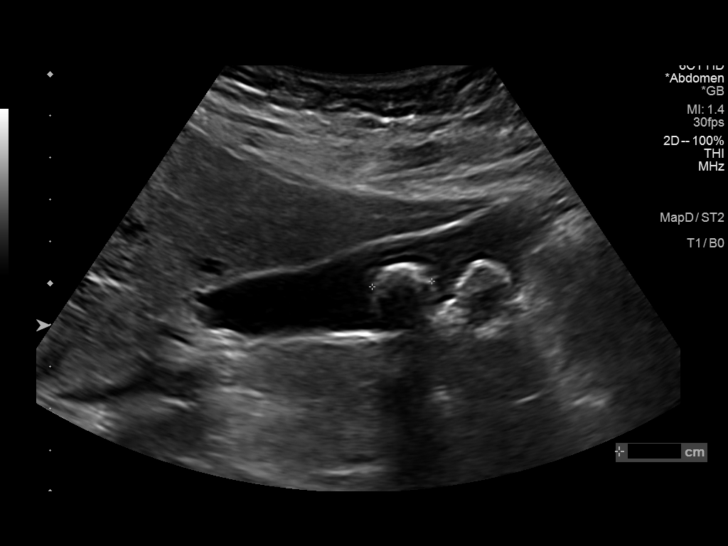
[im 7/79]
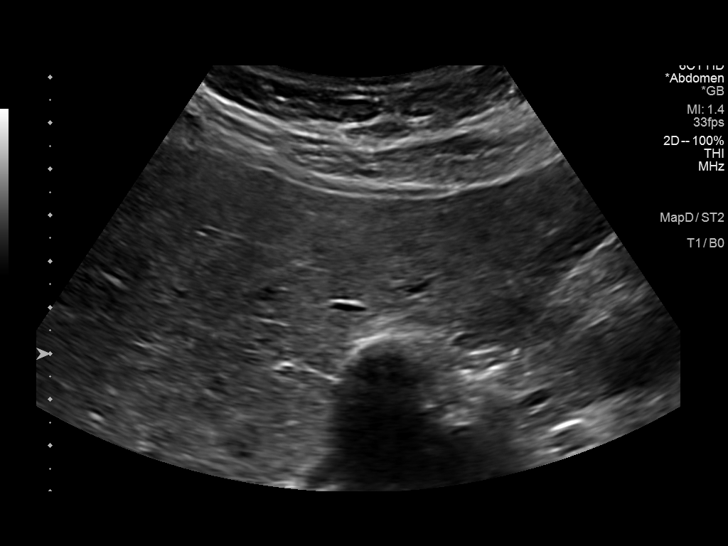
[im 14/79]
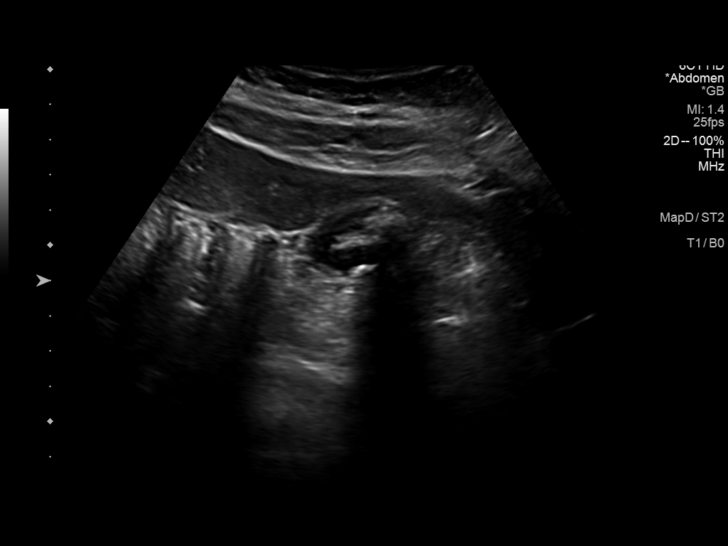
[im 20/79]
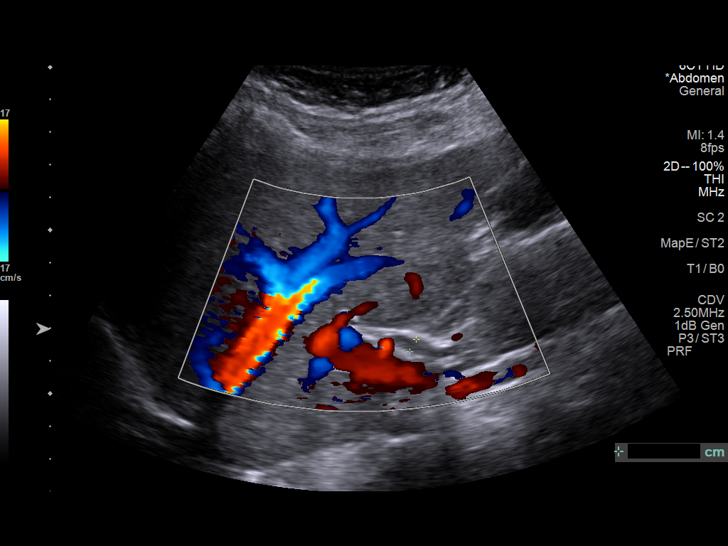
[im 27/79]
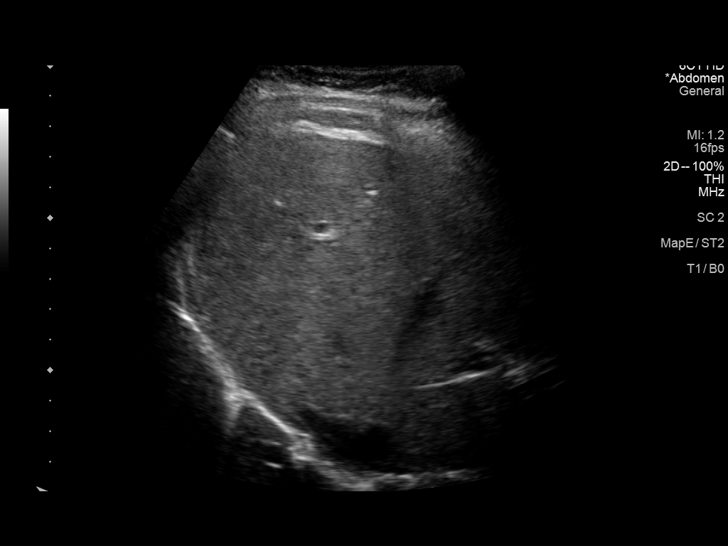
[im 30/79]
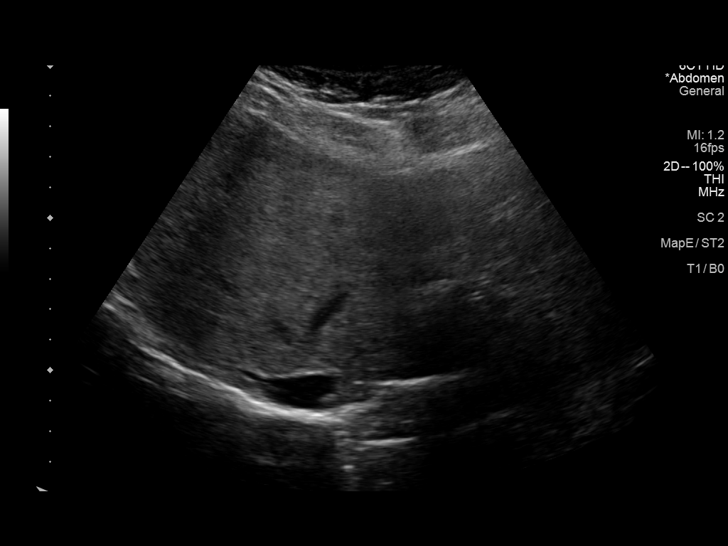
[im 36/79]
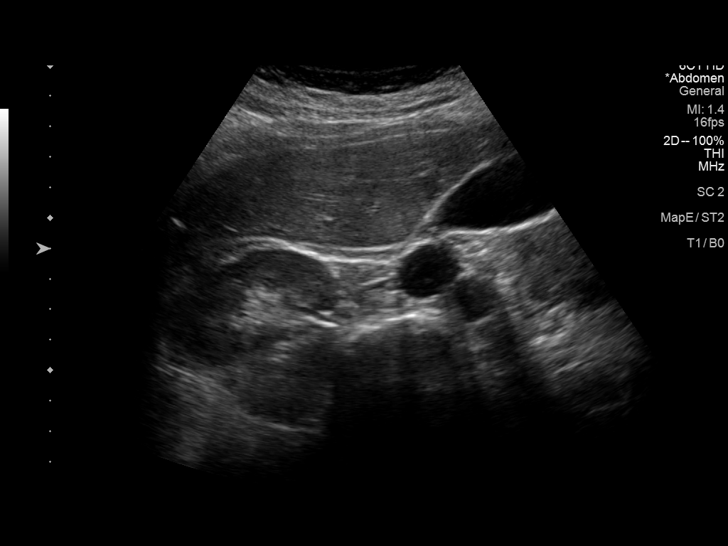
[im 43/79]
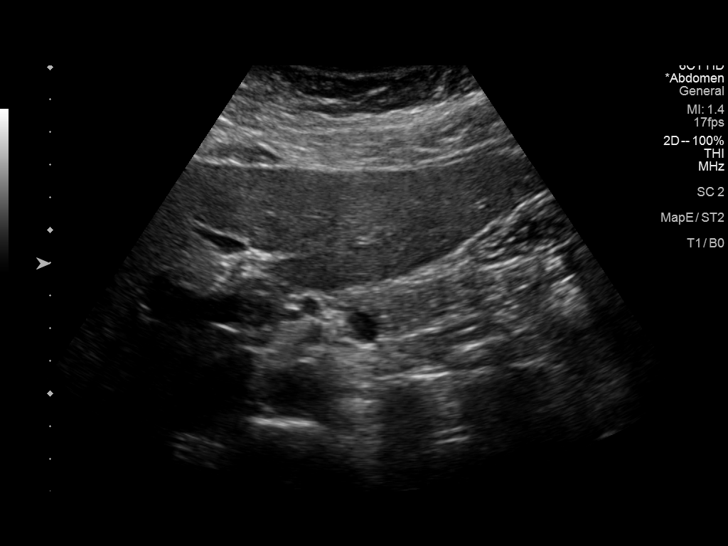
[im 49/79]
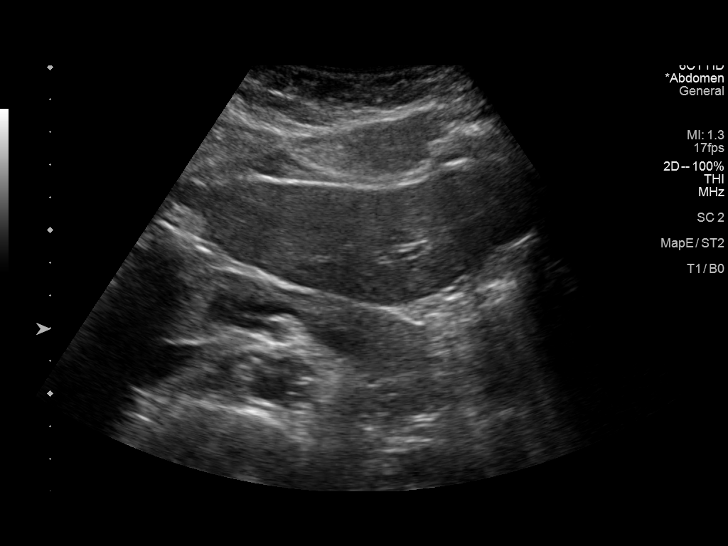
[im 53/79]
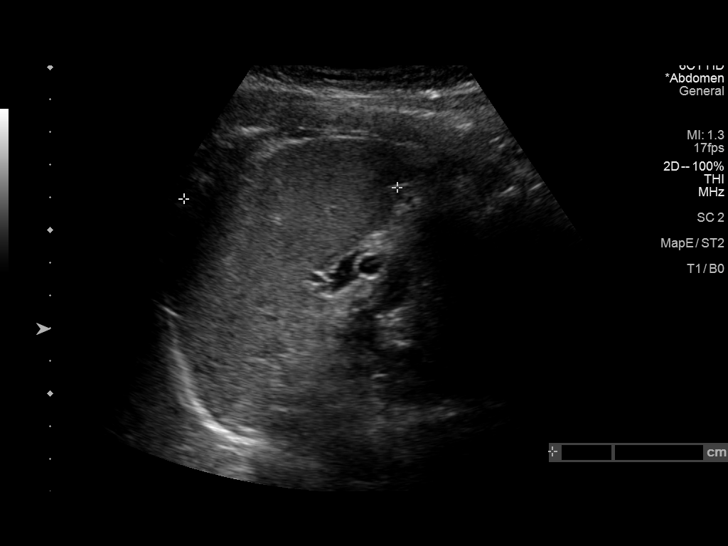
[im 59/79]
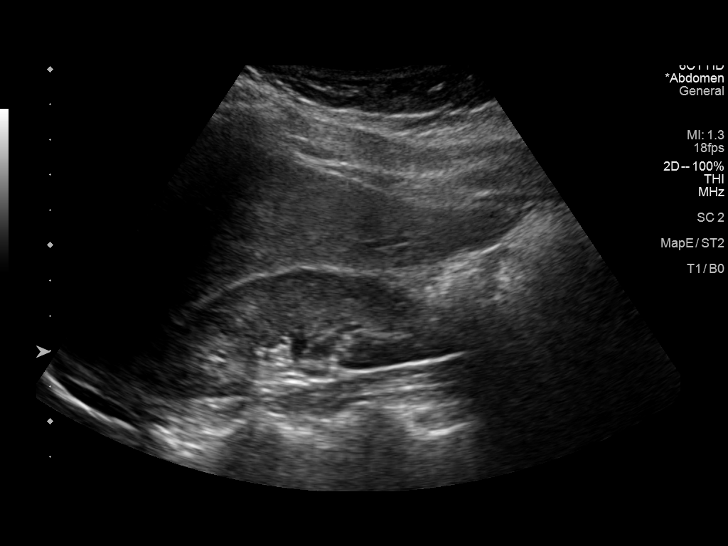
[im 66/79]
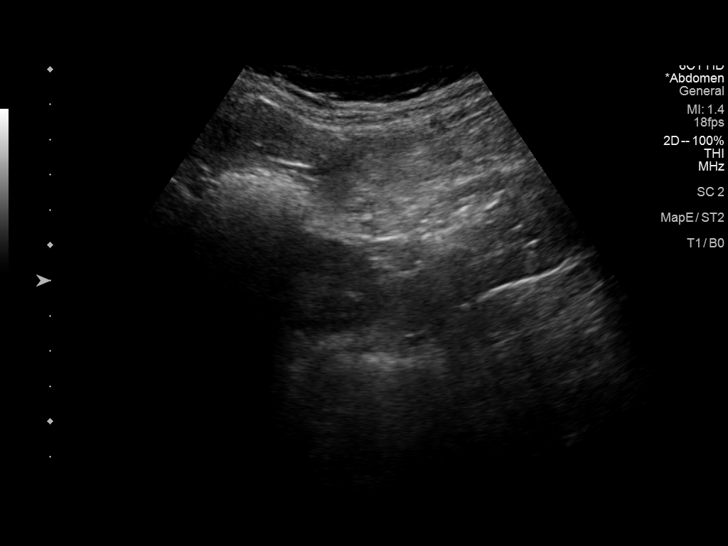
[im 72/79]
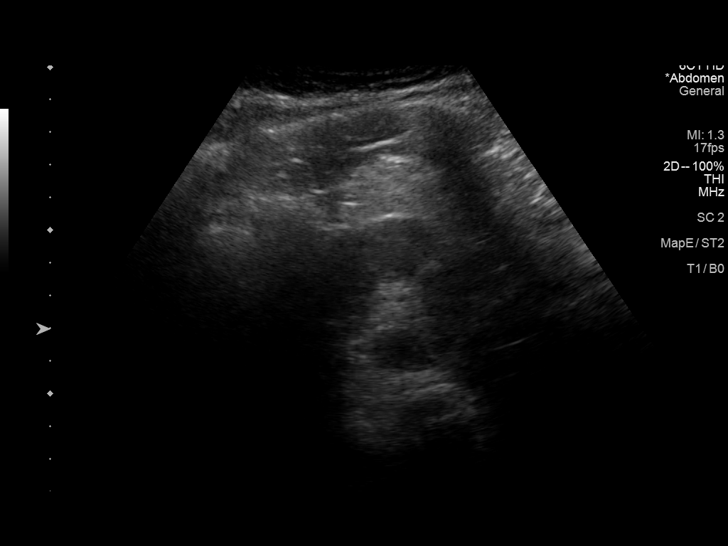
[im 79/79]
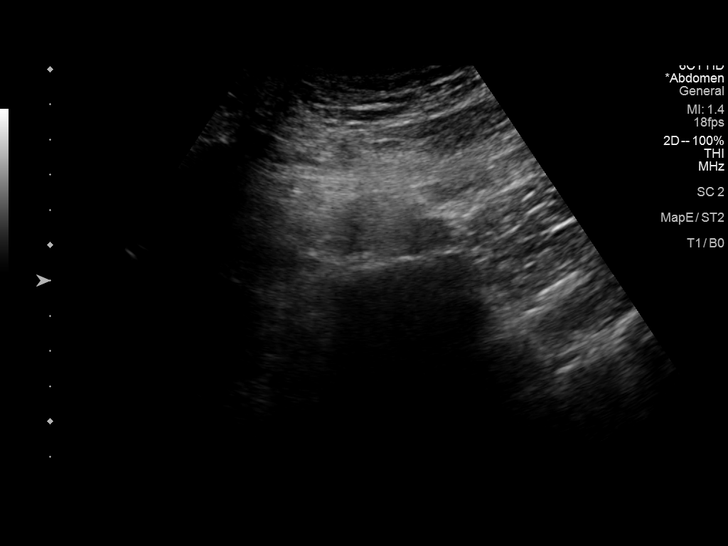

[14 of 25 positions shown; findings below may reference images not displayed]

FINDINGS: Gallbladder: Multiple large mobile gallstones measuring up to
cm. No gallbladder wall thickening. No sonographic Murphy sign noted
by sonographer.

Common bile duct: Diameter: 4 mm

Liver: No focal lesion identified. Within normal limits in
parenchymal echogenicity. Portal vein is patent on color Doppler
imaging with normal direction of blood flow towards the liver.

IVC: No abnormality visualized.

Pancreas: Visualized portion unremarkable.

Spleen: Size and appearance within normal limits.

Right Kidney: Length: 10.2 cm. Echogenicity within normal limits. No
mass or hydronephrosis visualized.

Left Kidney: Length: 10.0 cm. Echogenicity within normal limits. No
mass or hydronephrosis visualized.

Abdominal aorta: No aneurysm visualized.

Other findings: None.
IMPRESSION: 1. Cholelithiasis without evidence of cholecystitis.
2. Otherwise unremarkable abdominal ultrasound.

## 2021-02-25 IMAGING — MG DIGITAL SCREENING BILAT W/ TOMO W/ CAD
6 of 12 series · 6 of 36 positions shown · non-contrast
Comparison: Previous exam(s).

CLINICAL DATA: Screening.

EXAM:
DIGITAL SCREENING BILATERAL MAMMOGRAM WITH TOMO AND CAD

[L CC synth-2D]
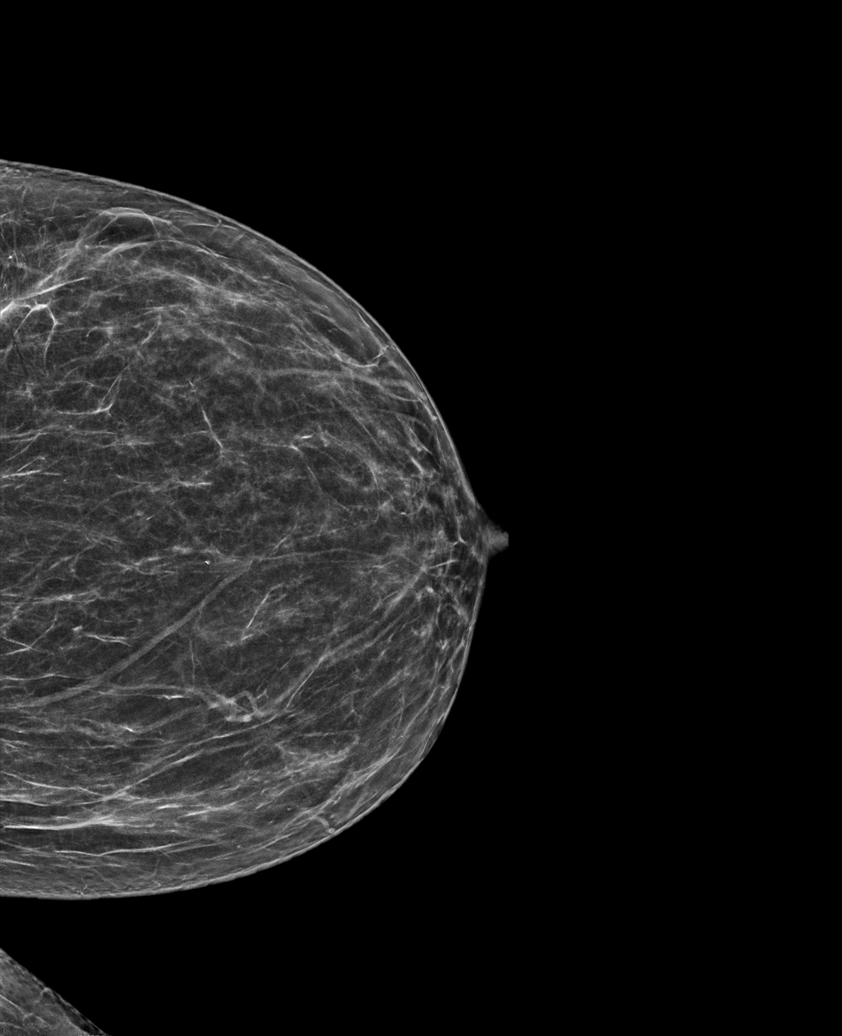

[L MLO synth-2D (1 of 3)]
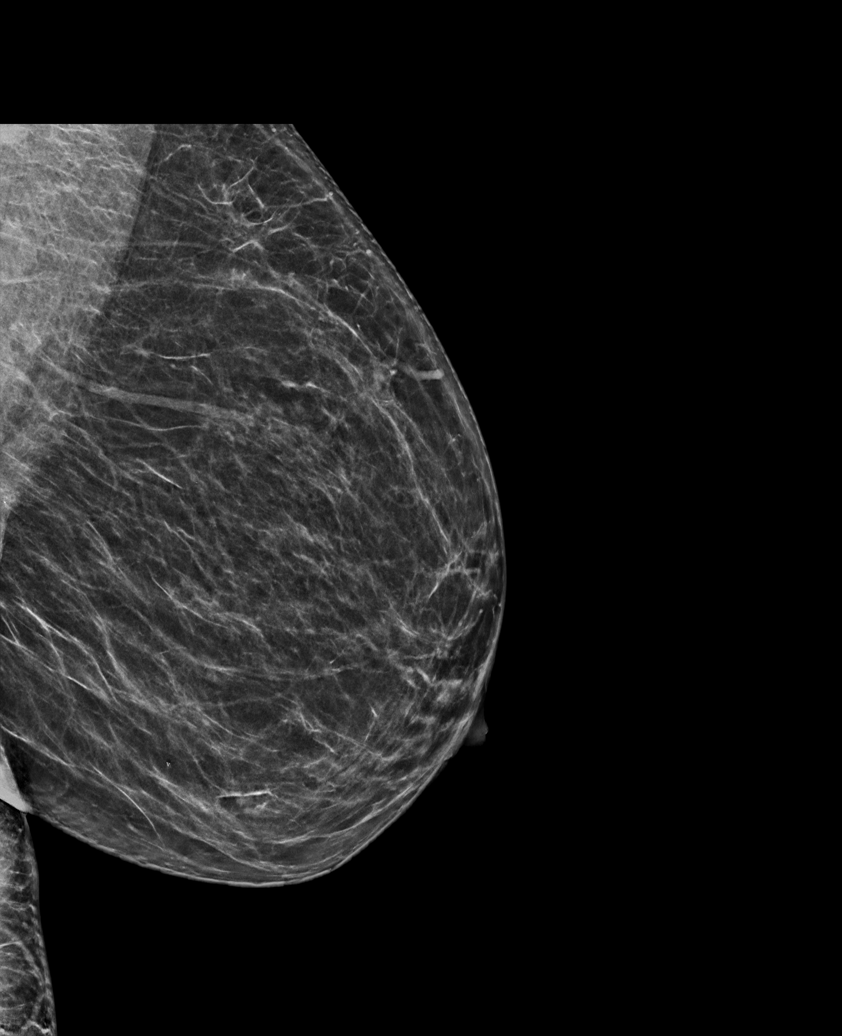

[R CC synth-2D]
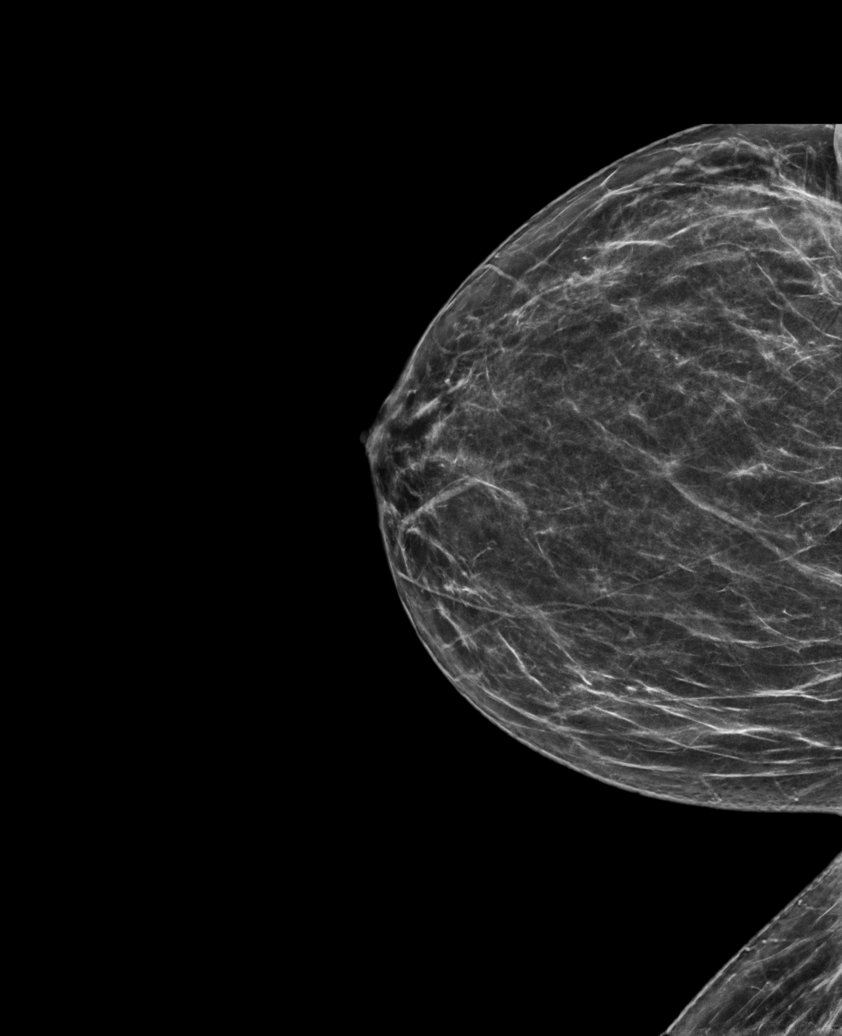

[R MLO synth-2D]
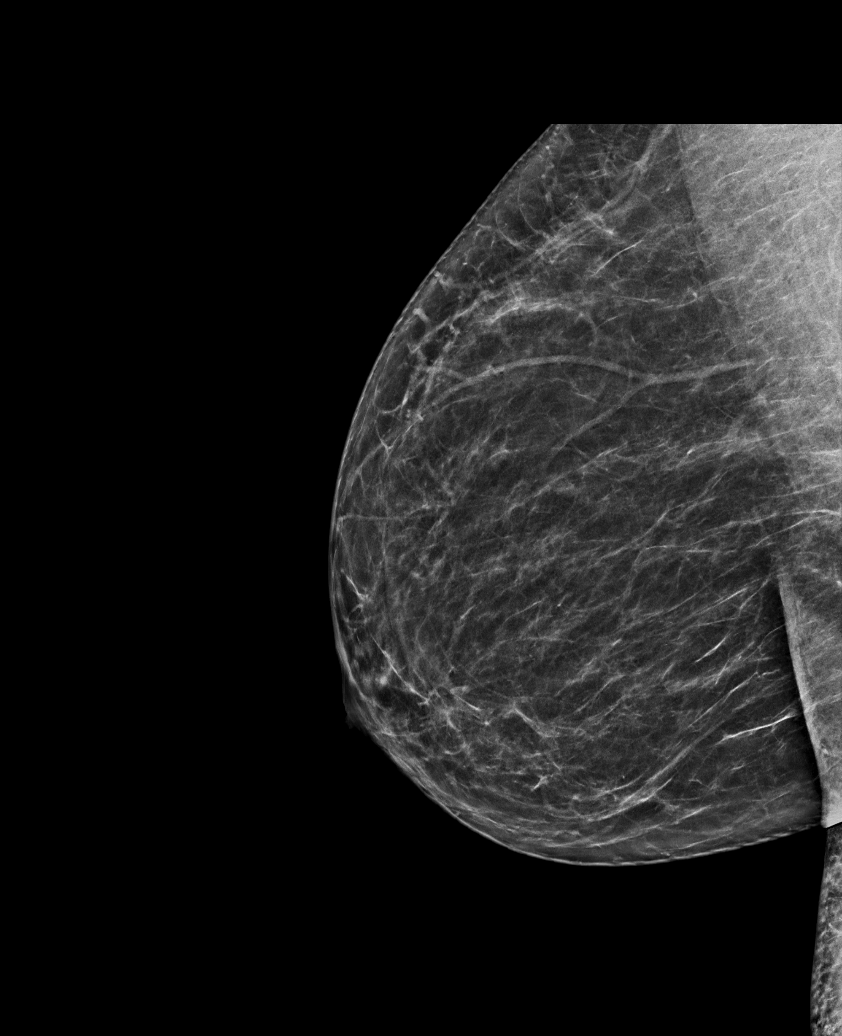

[L MLO synth-2D (2 of 3)]
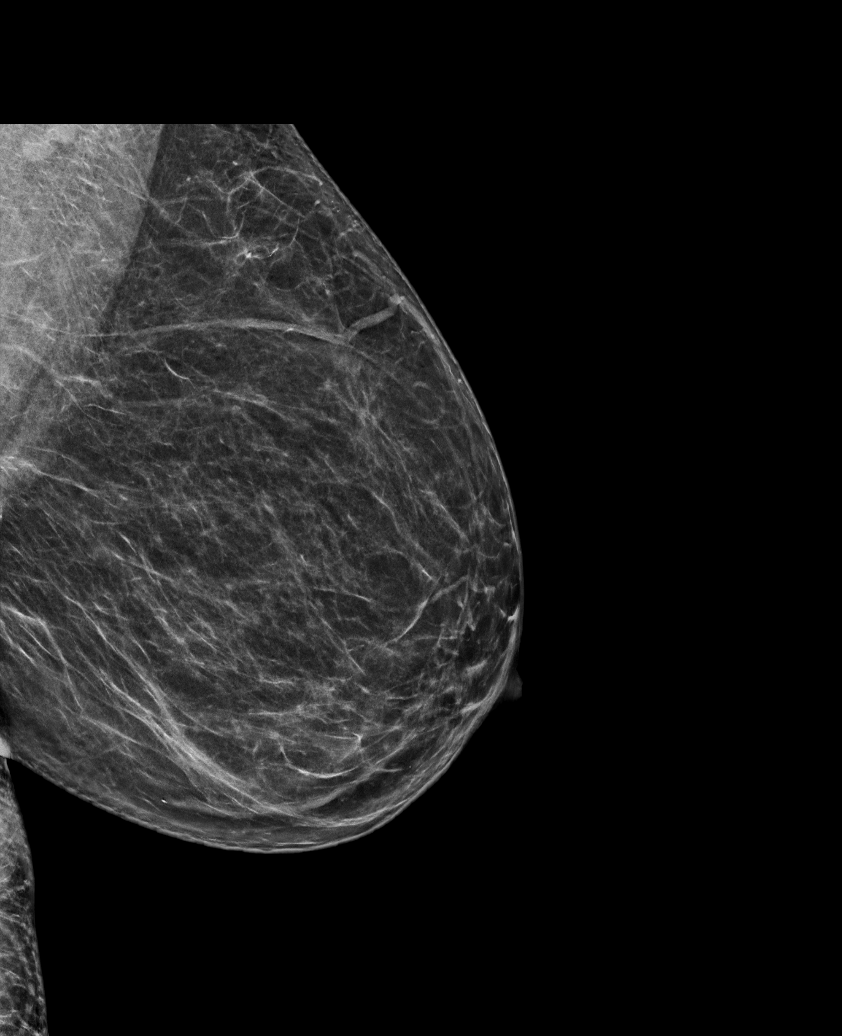

[L MLO synth-2D (3 of 3)]
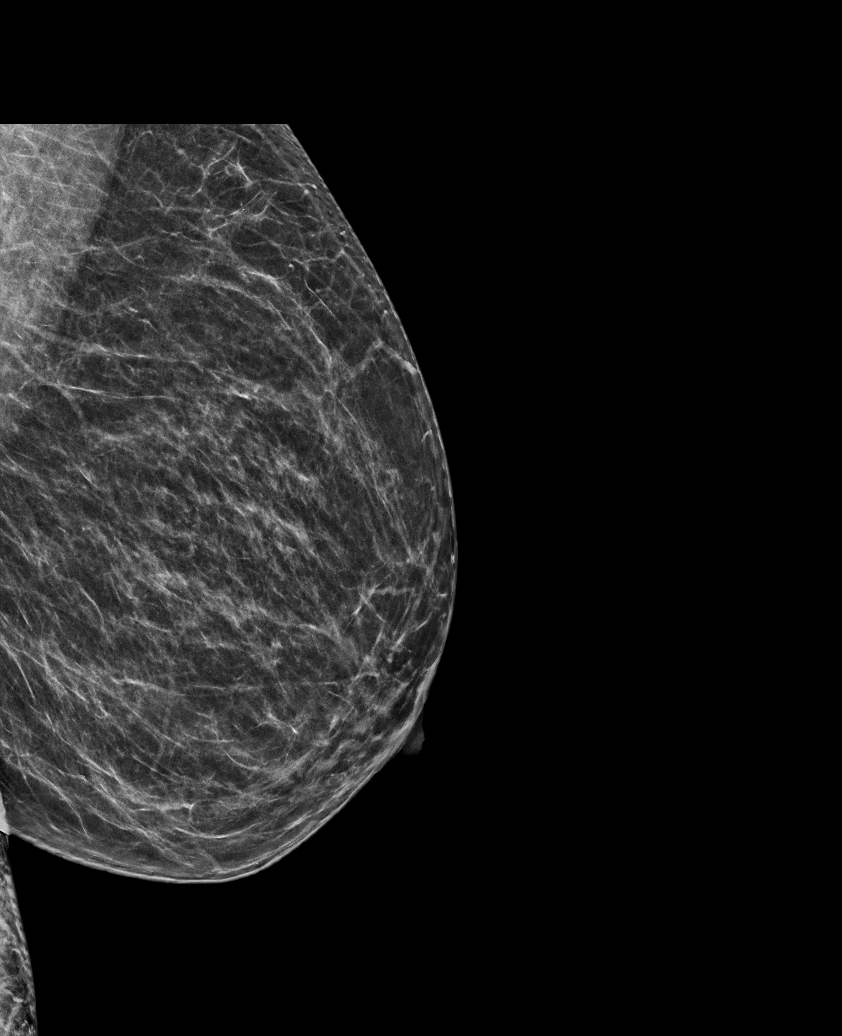

[6 of 36 positions shown; findings below may reference images not displayed]

ACR Breast Density Category b: There are scattered areas of
fibroglandular density.
FINDINGS: There are no findings suspicious for malignancy. Images were
processed with CAD.
IMPRESSION: No mammographic evidence of malignancy. A result letter of this
screening mammogram will be mailed directly to the patient.

RECOMMENDATION:
Screening mammogram in one year. (Code:[id number])

BI-RADS CATEGORY  1: Negative.

## 2022-07-15 ENCOUNTER — Other Ambulatory Visit: Payer: Self-pay | Admitting: Family Medicine

## 2022-07-15 DIAGNOSIS — Z1231 Encounter for screening mammogram for malignant neoplasm of breast: Secondary | ICD-10-CM

## 2022-07-19 ENCOUNTER — Ambulatory Visit
Admission: RE | Admit: 2022-07-19 | Discharge: 2022-07-19 | Disposition: A | Discharge status: Home / Self Care | Payer: BC Managed Care – PPO | Source: Ambulatory Visit | Attending: Family Medicine | Admitting: Family Medicine

## 2022-07-19 DIAGNOSIS — Z1231 Encounter for screening mammogram for malignant neoplasm of breast: Secondary | ICD-10-CM

## 2023-10-03 ENCOUNTER — Other Ambulatory Visit: Payer: Self-pay | Admitting: Family Medicine

## 2023-10-03 DIAGNOSIS — Z1231 Encounter for screening mammogram for malignant neoplasm of breast: Secondary | ICD-10-CM

## 2023-10-14 ENCOUNTER — Ambulatory Visit
Admission: RE | Admit: 2023-10-14 | Discharge: 2023-10-14 | Disposition: A | Discharge status: Home / Self Care | Source: Ambulatory Visit | Attending: Family Medicine | Admitting: Family Medicine

## 2023-10-14 DIAGNOSIS — Z1231 Encounter for screening mammogram for malignant neoplasm of breast: Secondary | ICD-10-CM
